# Patient Record
Sex: Male | Born: 1990 | Race: Black or African American | Hispanic: No | Marital: Single | Smoking: Current every day smoker
Health system: Southern US, Community
[De-identification: ages and names within clinical notes are randomized; demographics above are authoritative.]

## PROBLEM LIST (undated history)

## (undated) DIAGNOSIS — J45909 Unspecified asthma, uncomplicated: Secondary | ICD-10-CM

## (undated) HISTORY — PX: FINGER FRACTURE SURGERY: SHX638

## (undated) HISTORY — PX: OTHER SURGICAL HISTORY: SHX169

---
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
## 2009-08-18 ENCOUNTER — Emergency Department (HOSPITAL_COMMUNITY): Admission: EM | Admit: 2009-08-18 | Discharge: 2009-08-18 | Payer: Self-pay | Admitting: Emergency Medicine

## 2012-12-03 ENCOUNTER — Emergency Department (HOSPITAL_COMMUNITY): Payer: BC Managed Care – PPO

## 2012-12-03 ENCOUNTER — Encounter (HOSPITAL_COMMUNITY): Payer: Self-pay

## 2012-12-03 ENCOUNTER — Emergency Department (HOSPITAL_COMMUNITY)
Admission: EM | Admit: 2012-12-03 | Discharge: 2012-12-03 | Disposition: A | Discharge status: Home / Self Care | Payer: BC Managed Care – PPO | Attending: Emergency Medicine | Admitting: Emergency Medicine

## 2012-12-03 DIAGNOSIS — D72829 Elevated white blood cell count, unspecified: Secondary | ICD-10-CM | POA: Insufficient documentation

## 2012-12-03 DIAGNOSIS — J45909 Unspecified asthma, uncomplicated: Secondary | ICD-10-CM | POA: Insufficient documentation

## 2012-12-03 DIAGNOSIS — Z79899 Other long term (current) drug therapy: Secondary | ICD-10-CM | POA: Insufficient documentation

## 2012-12-03 DIAGNOSIS — L0231 Cutaneous abscess of buttock: Secondary | ICD-10-CM | POA: Insufficient documentation

## 2012-12-03 DIAGNOSIS — Z791 Long term (current) use of non-steroidal anti-inflammatories (NSAID): Secondary | ICD-10-CM | POA: Insufficient documentation

## 2012-12-03 DIAGNOSIS — M25559 Pain in unspecified hip: Secondary | ICD-10-CM | POA: Insufficient documentation

## 2012-12-03 DIAGNOSIS — L03317 Cellulitis of buttock: Secondary | ICD-10-CM | POA: Insufficient documentation

## 2012-12-03 DIAGNOSIS — F17200 Nicotine dependence, unspecified, uncomplicated: Secondary | ICD-10-CM | POA: Insufficient documentation

## 2012-12-03 DIAGNOSIS — I889 Nonspecific lymphadenitis, unspecified: Secondary | ICD-10-CM | POA: Insufficient documentation

## 2012-12-03 DIAGNOSIS — L049 Acute lymphadenitis, unspecified: Secondary | ICD-10-CM

## 2012-12-03 DIAGNOSIS — R509 Fever, unspecified: Secondary | ICD-10-CM | POA: Insufficient documentation

## 2012-12-03 HISTORY — DX: Unspecified asthma, uncomplicated: J45.909

## 2012-12-03 LAB — CBC WITH DIFFERENTIAL/PLATELET
Eosinophils Relative: 3 % (ref 0–5)
HCT: 43.6 % (ref 39.0–52.0)
Hemoglobin: 14.2 g/dL (ref 13.0–17.0)
Lymphocytes Relative: 15 % (ref 12–46)
Lymphs Abs: 2.8 10*3/uL (ref 0.7–4.0)
MCV: 82.9 fL (ref 78.0–100.0)
Monocytes Absolute: 1.8 10*3/uL — ABNORMAL HIGH (ref 0.1–1.0)
RBC: 5.26 MIL/uL (ref 4.22–5.81)
WBC: 17.9 10*3/uL — ABNORMAL HIGH (ref 4.0–10.5)

## 2012-12-03 LAB — BASIC METABOLIC PANEL
CO2: 27 mEq/L (ref 19–32)
Calcium: 9.4 mg/dL (ref 8.4–10.5)
Creatinine, Ser: 1.08 mg/dL (ref 0.50–1.35)
Glucose, Bld: 105 mg/dL — ABNORMAL HIGH (ref 70–99)

## 2012-12-03 MED ORDER — DOXYCYCLINE HYCLATE 100 MG IV SOLR
100.0000 mg | Freq: Two times a day (BID) | INTRAVENOUS | Status: DC
  Administered 2012-12-03: 100 mg via INTRAVENOUS
  Filled 2012-12-03 (×3): qty 100

## 2012-12-03 MED ORDER — BUPIVACAINE-EPINEPHRINE PF 0.5-1:200000 % IJ SOLN
20.0000 mL | Freq: Once | INTRAMUSCULAR | Status: AC
  Administered 2012-12-03: 04:00:00
  Filled 2012-12-03: qty 10

## 2012-12-03 MED ORDER — DOXYCYCLINE HYCLATE 100 MG PO CAPS: 100.0000 mg | ORAL_CAPSULE | Freq: Two times a day (BID) | ORAL | Status: DC

## 2012-12-03 MED ORDER — DOXYCYCLINE HYCLATE 100 MG IV SOLR
INTRAVENOUS | Status: AC
  Filled 2012-12-03: qty 100

## 2012-12-03 MED ORDER — LIDOCAINE-EPINEPHRINE 2 %-1:100000 IJ SOLN: 20.0000 mL | Freq: Once | INTRAMUSCULAR | Status: DC

## 2012-12-03 MED ORDER — IBUPROFEN 800 MG PO TABS: 800.0000 mg | ORAL_TABLET | Freq: Three times a day (TID) | ORAL | Status: DC

## 2012-12-03 MED ORDER — LACTATED RINGERS IV BOLUS (SEPSIS)
1000.0000 mL | Freq: Once | INTRAVENOUS | Status: AC
  Administered 2012-12-03: 1000 mL via INTRAVENOUS

## 2012-12-03 MED ORDER — IOHEXOL 300 MG/ML  SOLN
100.0000 mL | Freq: Once | INTRAMUSCULAR | Status: AC | PRN
  Administered 2012-12-03: 100 mL via INTRAVENOUS

## 2012-12-03 MED ORDER — HYDROCODONE-ACETAMINOPHEN 5-325 MG PO TABS: 1.0000 | ORAL_TABLET | Freq: Four times a day (QID) | ORAL | Status: DC | PRN

## 2012-12-03 MED ORDER — LIDOCAINE-EPINEPHRINE (PF) 2 %-1:200000 IJ SOLN
INTRAMUSCULAR | Status: AC
  Administered 2012-12-03: 20 mL
  Filled 2012-12-03: qty 20

## 2012-12-03 NOTE — ED Notes (Signed)
Patient with no complaints at this time. Respirations even and unlabored. Skin warm/dry. Discharge instructions reviewed with patient at this time. Patient given opportunity to voice concerns/ask questions. IV removed per policy and band-aid applied to site. Patient discharged at this time and left Emergency Department with steady gait.  

## 2012-12-03 NOTE — ED Notes (Signed)
Pt reports he has a large swelling to inside of left upper thigh for several days.  Pt denies drainage from site, states he has had smaller abcesses similar but this is much bigger.

## 2012-12-03 NOTE — ED Notes (Signed)
Patient meds infusing well. He will be discharged after they are completed. He verbalized unerstanding

## 2012-12-03 NOTE — ED Provider Notes (Signed)
CSN: [id number]     Arrival date & time 12/03/12  0243 History     First MD Initiated Contact with Patient 12/03/12 0330     No chief complaint on file.   HPI Frank Diaz is a 22 y.o. male with history of asthma and prior abscesses to the buttocks and face presents with a left inguinal swelling for the last week. It's been growing in size steadily but has not been that painful over the last 7 days, and over the last 2 days, the swelling increased greatly in size and in pain, is now severe, it hurts to walk, it hurts on palpation, denies any fevers, chills, chest pain, shortness of breath, nausea vomiting, diarrhea, dysuria, penile discharge.   Past Medical History  Diagnosis Date  . Asthma    History reviewed. No pertinent past surgical history. No family history on file. History  Substance Use Topics  . Smoking status: Current Every Day Smoker  . Smokeless tobacco: Not on file  . Alcohol Use: No    Review of Systems At least 10pt or greater review of systems completed and are negative except where specified in the HPI.  Allergies  Review of patient's allergies indicates no known allergies.  Home Medications   Current Outpatient Rx  Name  Route  Sig  Dispense  Refill  . doxycycline (VIBRAMYCIN) 100 MG capsule   Oral   Take 1 capsule (100 mg total) by mouth 2 (two) times daily.   20 capsule   0   . HYDROcodone-acetaminophen (NORCO/VICODIN) 5-325 MG per tablet   Oral   Take 1-2 tablets by mouth every 6 (six) hours as needed for pain.   17 tablet   0   . ibuprofen (ADVIL,MOTRIN) 800 MG tablet   Oral   Take 1 tablet (800 mg total) by mouth 3 (three) times daily.   21 tablet   0    BP 161/96  Pulse 111  Temp(Src) 100.9 F (38.3 C) (Oral)  Resp 20  Ht 6' (1.829 m)  Wt 305 lb (138.347 kg)  BMI 41.36 kg/m2  SpO2 98% Physical Exam  Musculoskeletal:       Legs:   Nursing notes reviewed.  Electronic medical record reviewed. VITAL SIGNS:   Filed Vitals:   12/03/12 0302  BP: 161/96  Pulse: 111  Temp: 100.9 F (38.3 C)  TempSrc: Oral  Resp: 20  Height: 6' (1.829 m)  Weight: 305 lb (138.347 kg)  SpO2: 98%   CONSTITUTIONAL: Awake, oriented, appears non-toxic HENT: Atraumatic, normocephalic, oral mucosa pink and moist, airway patent. Nares patent without drainage. External ears normal. EYES: Conjunctiva clear, EOMI, PERRLA NECK: Trachea midline, non-tender, supple CARDIOVASCULAR: Normal heart rate, Normal rhythm, No murmurs, rubs, gallops PULMONARY/CHEST: Clear to auscultation, no rhonchi, wheezes, or rales. Symmetrical breath sounds. Non-tender. ABDOMINAL: Non-distended, soft, non-tender - no rebound or guarding.  BS normal. GU: Normal circumcised male, no discharge, no rash or sores, no tenderness in the testicles or epididymis to palpation. No hernias appreciated.  Large spongy mass very tender to palpation in the left inguinal region mid thigh - see picture. NEUROLOGIC: Non-focal, moving all four extremities, no gross sensory or motor deficits. EXTREMITIES: No clubbing, cyanosis, or edema SKIN: Warm, Dry, No erythema, No rash  ED Course   INCISION AND DRAINAGE Date/Time: 12/03/2012 7:20 AM Performed by: Jones Skene Authorized by: Jones Skene Consent: Verbal consent obtained. Consent given by: patient Patient identity confirmed: verbally with patient Type: abscess Body area: lower extremity  Location details: left hip Anesthesia: local infiltration Local anesthetic: lidocaine 2% with epinephrine and bupivacaine 0.5% with epinephrine Anesthetic total: 12 ml Patient sedated: no Scalpel size: 11 Incision type: single straight Complexity: simple Drainage characteristics: None. Drainage amount: scant Wound treatment: wound left open (Half centimeter of wound was left open, the rest was closed using a running 3-0 Prolene.) Packing material: none Patient tolerance: Patient tolerated the procedure well with no immediate  complications.   (including critical care time)  Labs Reviewed  CBC WITH DIFFERENTIAL - Abnormal; Notable for the following:    WBC 17.9 (*)    Neutro Abs 12.7 (*)    Monocytes Absolute 1.8 (*)    All other components within normal limits  BASIC METABOLIC PANEL - Abnormal; Notable for the following:    Glucose, Bld 105 (*)    All other components within normal limits   Ct Pelvis W Contrast  12/03/2012   *RADIOLOGY REPORT*  Clinical Data:  Painful lesion proximal left thigh.  CT PELVIS WITH CONTRAST  Technique:  Multidetector CT imaging of the pelvis was performed using the standard protocol following the bolus administration of intravenous contrast.  Contrast: OMNIPAQUE IOHEXOL 300 MG/ML  SOLN  Comparison:   None.  Findings:  There is infiltration of subcutaneous fat in the medial aspect of the left thigh.  A large lymph node is identified measuring 4.0 x 2.0 cm.  There is some fluid within the medial aspect of this lymph node compatible with suppurative adenitis. Additional smaller bilateral groin lymph nodes are identified. Visualized musculature appears normal.  Imaged intrapelvic contents are unremarkable.  No focal bony abnormality is identified.  IMPRESSION: Cellulitis medial left side with associated suppurative adenitis.   Original Report Authenticated By: Holley Dexter, M.D.   1. Suppurative lymphadenitis   2. Thigh pain, left   3. Fever   4. Leukocytosis     MDM  Patient presents with large swelling to the left inguinal region. Site ultrasound shows cobblestoning consistent with cellulitis, there did appear to be a pocket of pus, verbally consented patient to perform incision and drainage, incised 3 cm incision with minimal return. Sutured incision leaving half a centimeter open to drain in case it abscesses. Palpated into the wound, there appears to be a deeper pocket of infection.  Obtain CT the patient's pelvis and thighs, showing likely super lymphadenitis of the left  groin. No abscess present at this time. Patient does have a increased H[place] count at 17.9 he is febrile. Discussed this patient's case with Dr. Lovell Sheehan surgeon on call, patient will followup next Tuesday, we'll put him on doxycycline nto cover for MRSA and strep.  Pt stands to return to the emergency department for any draining pus, worsening fevers, chills, pain in the leg, or any other concerning symptoms.     I explained the diagnosis and have given explicit precautions to return to the ER including any other new or worsening symptoms. The patient understands and accepts the medical plan as it's been dictated and I have answered his questions. Discharge instructions concerning home care and prescriptions have been given.  The patient is STABLE and is discharged to home in good condition.   Jones Skene, MD 12/03/12 [phone]

## 2012-12-05 ENCOUNTER — Encounter (HOSPITAL_COMMUNITY): Payer: Self-pay | Admitting: *Deleted

## 2012-12-05 ENCOUNTER — Emergency Department (HOSPITAL_COMMUNITY)
Admission: EM | Admit: 2012-12-05 | Discharge: 2012-12-05 | Disposition: A | Discharge status: Home / Self Care | Payer: BC Managed Care – PPO | Attending: Emergency Medicine | Admitting: Emergency Medicine

## 2012-12-05 ENCOUNTER — Emergency Department (HOSPITAL_COMMUNITY): Payer: BC Managed Care – PPO

## 2012-12-05 DIAGNOSIS — Z791 Long term (current) use of non-steroidal anti-inflammatories (NSAID): Secondary | ICD-10-CM | POA: Insufficient documentation

## 2012-12-05 DIAGNOSIS — R609 Edema, unspecified: Secondary | ICD-10-CM | POA: Insufficient documentation

## 2012-12-05 DIAGNOSIS — Z79899 Other long term (current) drug therapy: Secondary | ICD-10-CM | POA: Insufficient documentation

## 2012-12-05 DIAGNOSIS — L0390 Cellulitis, unspecified: Secondary | ICD-10-CM

## 2012-12-05 DIAGNOSIS — J45909 Unspecified asthma, uncomplicated: Secondary | ICD-10-CM | POA: Insufficient documentation

## 2012-12-05 DIAGNOSIS — M25559 Pain in unspecified hip: Secondary | ICD-10-CM | POA: Insufficient documentation

## 2012-12-05 DIAGNOSIS — Z87891 Personal history of nicotine dependence: Secondary | ICD-10-CM | POA: Insufficient documentation

## 2012-12-05 MED ORDER — HYDROCODONE-ACETAMINOPHEN 5-325 MG PO TABS
2.0000 | ORAL_TABLET | ORAL | Status: DC | PRN
Start: 2012-12-05 — End: 2013-12-07

## 2012-12-05 MED ORDER — LIDOCAINE HCL (PF) 1 % IJ SOLN
INTRAMUSCULAR | Status: AC
  Filled 2012-12-05: qty 5

## 2012-12-05 NOTE — ED Notes (Signed)
Pt had I and D done of lt thigh on 8/6 , sutures in place.  Here for recheck.

## 2012-12-05 NOTE — ED Provider Notes (Signed)
CSN: [id number]     Arrival date & time 12/05/12  1407 History     First MD Initiated Contact with Patient 12/05/12 1441     Chief Complaint  Patient presents with  . Wound Check   (Consider location/radiation/quality/duration/timing/severity/associated sxs/prior Treatment) HPI Comments: Patient presents for recheck of an I&D that was done on August 6. He had swelling of his left proximal thigh at that time. A CT scan was done which showed some lymphadenopathy but no discrete abscess. Denying he was done and there is small amount of per discharge. This was a 3 cm I&D and the proximal portion was closed with sutures. He states that the swelling has gotten more pronounced. He is on doxycycline and he is no longer having any fevers. He does feel like the pain and swelling has gotten more pronounced in the area. He denies any drainage from the wound. He does have a history of skin abscesses in the past. He has an appointment to followup with surgery on August 18.  Patient is a 22 y.o. male presenting with wound check.  Wound Check Pertinent negatives include no chest pain, no abdominal pain, no headaches and no shortness of breath.    Past Medical History  Diagnosis Date  . Asthma    Past Surgical History  Procedure Laterality Date  . Tubes in ears     History reviewed. No pertinent family history. History  Substance Use Topics  . Smoking status: Former Games developer  . Smokeless tobacco: Not on file  . Alcohol Use: No    Review of Systems  Constitutional: Negative for fever, chills, diaphoresis and fatigue.  HENT: Negative for congestion, rhinorrhea and sneezing.   Eyes: Negative.   Respiratory: Negative for cough, chest tightness and shortness of breath.   Cardiovascular: Negative for chest pain and leg swelling.  Gastrointestinal: Negative for nausea, vomiting, abdominal pain, diarrhea and blood in stool.  Genitourinary: Negative for frequency, hematuria, flank pain and difficulty  urinating.  Musculoskeletal: Negative for back pain and arthralgias.  Skin: Positive for wound. Negative for rash.  Neurological: Negative for dizziness, speech difficulty, weakness, numbness and headaches.    Allergies  Review of patient's allergies indicates no known allergies.  Home Medications   Current Outpatient Rx  Name  Route  Sig  Dispense  Refill  . doxycycline (VIBRAMYCIN) 100 MG capsule   Oral   Take 1 capsule (100 mg total) by mouth 2 (two) times daily.   20 capsule   0   . HYDROcodone-acetaminophen (NORCO/VICODIN) 5-325 MG per tablet   Oral   Take 1-2 tablets by mouth every 6 (six) hours as needed for pain.   17 tablet   0   . ibuprofen (ADVIL,MOTRIN) 800 MG tablet   Oral   Take 1 tablet (800 mg total) by mouth 3 (three) times daily.   21 tablet   0   . HYDROcodone-acetaminophen (NORCO/VICODIN) 5-325 MG per tablet   Oral   Take 2 tablets by mouth every 4 (four) hours as needed for pain.   15 tablet   0    BP 143/85  Pulse 92  Temp(Src) 98.8 F (37.1 C) (Oral)  Resp 18  Ht 6' (1.829 m)  Wt 305 lb (138.347 kg)  BMI 41.36 kg/m2  SpO2 99% Physical Exam  Constitutional: He is oriented to person, place, and time. He appears well-developed and well-nourished.  HENT:  Head: Normocephalic and atraumatic.  Eyes: Pupils are equal, round, and reactive to light.  Neck: Normal range of motion. Neck supple.  Cardiovascular: Normal rate, regular rhythm and normal heart sounds.   Pulmonary/Chest: Effort normal and breath sounds normal. No respiratory distress. He has no wheezes. He has no rales. He exhibits no tenderness.  Abdominal: Soft. Bowel sounds are normal. There is no tenderness. There is no rebound and no guarding.  Musculoskeletal: Normal range of motion. He exhibits no edema.  Lymphadenopathy:    He has no cervical adenopathy.  Neurological: He is alert and oriented to person, place, and time.  Skin: Skin is warm and dry. No rash noted.  Patient  has a 3 cm area on his left proximal thigh that status post an I&D. There are running sutures in place. The area is pretty well closed with no drainage. There is a 4-5 cm area of swelling. There is edema to the area. No discrete fluctuance is noted. There is mild surrounding erythema.  Psychiatric: He has a normal mood and affect.    ED Course   Procedures (including critical care time)  Labs Reviewed - No data to display [place] Extrem Low Left Ltd  12/05/2012   *RADIOLOGY REPORT*  Clinical Data: 22 year old male with left proximal thigh and lower extremity palpable abnormality.  Possible abscess.  ULTRASOUND LEFT LOWER EXTREMITY LIMITED  Technique:  Ultrasound examination of the region of interest in the left lower extremity was performed.  Comparison:  Pelvis CT including the proximal left lower extremity 12/03/2012.  Findings: Scanning in the area of clinical concern redemonstrates oval hypoechoic but solid and vascular left inguinal lymph nodes, up to 11 mm short axis.  Subcutaneous edema is demonstrated, with a small volume of fluid tracking in the deep soft tissue spaces, but no organized or drainable fluid collection.  IMPRESSION: Stable appearance of the proximal left lower extremity with lymphadenopathy and subcutaneous edema, but no abscess/organized or drainable fluid collection.   Original Report Authenticated By: Erskine Speed, M.D.   1. Cellulitis     MDM  I did removed the Prolene sutures and attempted to express pus however this was unsuccessful. There was only bloody discharge. I did a bedside ultrasound by do not see any discrete abscess. I will go ahead and get a ultrasound done by radiology to assess for deeper abscess.    No drainable abscess is noted on ultrasound. Patient's cellulitis appears to have improved but there is slightly larger amount swelling in the area of the I&D. On exam it seems to be mostly edema. Advised him to continue antibiotics. The won't be left open. He has a  followup appointment with Dr. Lovell Sheehan on August 18 but I advised them if he hasn't noted improvement over the weekend and to attempt to follow Dr. Lovell Sheehan on Monday. Advised to return here if his symptoms worsen or is unable to get in to see Dr. Lovell Sheehan.  Rolan Bucco, MD 12/05/12 [phone]

## 2013-12-07 ENCOUNTER — Other Ambulatory Visit: Payer: Self-pay

## 2013-12-07 ENCOUNTER — Emergency Department (HOSPITAL_COMMUNITY): Payer: BC Managed Care – PPO

## 2013-12-07 ENCOUNTER — Emergency Department (HOSPITAL_COMMUNITY)
Admission: EM | Admit: 2013-12-07 | Discharge: 2013-12-08 | Disposition: A | Discharge status: Home / Self Care | Payer: BC Managed Care – PPO | Attending: Emergency Medicine | Admitting: Emergency Medicine

## 2013-12-07 DIAGNOSIS — R079 Chest pain, unspecified: Secondary | ICD-10-CM | POA: Insufficient documentation

## 2013-12-07 DIAGNOSIS — Z87891 Personal history of nicotine dependence: Secondary | ICD-10-CM | POA: Insufficient documentation

## 2013-12-07 DIAGNOSIS — M79609 Pain in unspecified limb: Secondary | ICD-10-CM | POA: Insufficient documentation

## 2013-12-07 DIAGNOSIS — M546 Pain in thoracic spine: Secondary | ICD-10-CM | POA: Insufficient documentation

## 2013-12-07 DIAGNOSIS — J45901 Unspecified asthma with (acute) exacerbation: Secondary | ICD-10-CM | POA: Insufficient documentation

## 2013-12-07 DIAGNOSIS — R0789 Other chest pain: Secondary | ICD-10-CM

## 2013-12-07 NOTE — ED Notes (Signed)
Pain rt postero-lateral chest.  2 weeks ago had episode of numbness of lt side of body , and since then has had pain in rt postero- lateral chest.  Alert , NAD

## 2013-12-07 NOTE — ED Notes (Signed)
Patient reports mid back for approximately a week. Also reports mid chest pain for two days that is worse with deep inspiration. Reports shocking pains to right leg that has bothered him for two months.

## 2013-12-07 NOTE — ED Provider Notes (Signed)
CSN: [id number]     Arrival date & time 12/07/13  2026 History   First MD Initiated Contact with Patient 12/07/13 2220     Chief Complaint  Patient presents with  . Back Pain     (Consider location/radiation/quality/duration/timing/severity/associated sxs/prior Treatment) Patient is a 23 y.o. male presenting with back pain. The history is provided by the patient.  Back Pain Location:  Thoracic spine Quality:  Stabbing Radiates to:  R posterior upper leg Pain severity:  Moderate Pain is:  Same all the time Onset quality:  Gradual Duration:  1 week Timing:  Constant Progression:  Worsening Chronicity:  New Context: physical stress   Relieved by:  Nothing Worsened by:  Movement, bending and ambulation Ineffective treatments:  OTC medications Associated symptoms: chest pain   Associated symptoms: no bladder incontinence, no bowel incontinence, no dysuria, no fever and no headaches    Frank Diaz is a 23 y.o. obese male who presents to the ED with pain to the right thoracic area that started about a week ago. The pain radiates to the right anterior rib area. He states the pain is so bad sometimes he feels short of breath. For the past 2 months he has had occasional right leg pain that comes and goes. When it comes it feels sharp. He sometimes has chest pain that comes when he takes a deep breath. 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   Past Medical History  Diagnosis Date  . Asthma    Past Surgical History  Procedure Laterality Date  . Tubes in ears     No family history on file. History  Substance Use Topics  . Smoking status: Former Games developermoker  . Smokeless tobacco: Not on file  . Alcohol Use: No    Review of Systems  Constitutional: Negative for fever and chills.  HENT: Negative.   Eyes: Negative for visual disturbance.  Respiratory: Positive for shortness of breath. Negative for cough.   Cardiovascular: Positive for chest pain.  Gastrointestinal: Negative for  nausea, vomiting, diarrhea and bowel incontinence.  Genitourinary: Negative for bladder incontinence and dysuria.  Musculoskeletal: Positive for back pain.  Skin: Negative for rash.  Neurological: Negative for syncope and headaches.  Psychiatric/Behavioral: Negative for confusion. The patient is not nervous/anxious.     Allergies  Review of patient's allergies indicates no known allergies.  Home Medications   Prior to Admission medications   Medication Sig Start Date End Date Taking? Authorizing Provider  aspirin-acetaminophen-caffeine (EXCEDRIN MIGRAINE) [id number] MG per tablet Take 1 tablet by mouth every 6 (six) hours as needed for headache.   Yes Historical Provider, MD   BP 161/99  Pulse 92  Temp(Src) 99.1 F (37.3 C) (Oral)  Resp 20  Ht 6' (1.829 m)  Wt 310 lb (140.615 kg)  BMI 42.03 kg/m2  SpO2 98% Physical Exam  Nursing note and vitals reviewed. Constitutional: He is oriented to person, place, and time. He appears well-developed and well-nourished. No distress.  HENT:  Head: Normocephalic and atraumatic.  Eyes: EOM are normal. Pupils are equal, round, and reactive to light.  Neck: Normal range of motion. Neck supple.  Cardiovascular: Normal rate and regular rhythm.   Pulmonary/Chest: Effort normal. No respiratory distress. He has no wheezes. He has no rales.  Abdominal: Soft. Bowel sounds are normal. There is no tenderness.  Musculoskeletal: Normal range of motion. He exhibits no edema.       Thoracic back: He exhibits tenderness and spasm. He exhibits normal range of motion and  normal pulse.       Back:  Pain radiates to the anterior rib area.  Neurological: He is alert and oriented to person, place, and time. He has normal strength. No cranial nerve deficit or sensory deficit. Coordination and gait normal.  Reflex Scores:      Bicep reflexes are 2+ on the right side and 2+ on the left side.      Brachioradialis reflexes are 2+ on the right side and 2+ on the  left side.      Patellar reflexes are 2+ on the right side and 2+ on the left side.      Achilles reflexes are 2+ on the right side and 2+ on the left side. Skin: Skin is warm and dry.  Psychiatric: He has a normal mood and affect. His behavior is normal.    ED Course  Procedures (including critical care time) Labs Review Labs Reviewed - No data to display EKG Interpretation Dg Chest 2 View  12/07/2013   CLINICAL DATA:  Mid back pain for 1 week. Chest pain, worse with deep inspiration.  EXAM: CHEST  2 VIEW  COMPARISON:  None.  FINDINGS: The lungs are well-aerated and clear. There is no evidence of focal opacification, pleural effusion or pneumothorax.  The heart is normal in size; the mediastinal contour is within normal limits. No acute osseous abnormalities are seen.  IMPRESSION: No acute cardiopulmonary process seen.   Electronically Signed   By: Roanna Raider M.D.   On: 12/07/2013 21:13    MDM  23 y.o. morbidly obese male with right thoracic pain x 1 week and having mid chest pain with deep breath. Also complains of off and on right leg pain that has been there for over two months. Stable for discharge with normal chest x-ray and EKG. Discussed with the patient in detail need for follow up with a primary care doctor. He voices understanding and agrees with plan.    Medication List    TAKE these medications       cyclobenzaprine 10 MG tablet  Commonly known as:  FLEXERIL  Take 1 tablet (10 mg total) by mouth 2 (two) times daily as needed for muscle spasms.     HYDROcodone-acetaminophen 5-325 MG per tablet  Commonly known as:  NORCO/VICODIN  Take 1 tablet by mouth every 4 (four) hours as needed.     naproxen 500 MG tablet  Commonly known as:  NAPROSYN  Take 1 tablet (500 mg total) by mouth 2 (two) times daily.      ASK your doctor about these medications       aspirin-acetaminophen-caffeine 250-250-65 MG per tablet  Commonly known as:  EXCEDRIN MIGRAINE  Take 1 tablet by  mouth every 6 (six) hours as needed for headache.          [facility] Orlene Och, [place] 12/08/13 [phone]

## 2013-12-08 MED ORDER — CYCLOBENZAPRINE HCL 10 MG PO TABS: 10.0000 mg | ORAL_TABLET | Freq: Two times a day (BID) | ORAL | Status: DC | PRN

## 2013-12-08 MED ORDER — NAPROXEN 500 MG PO TABS: 500.0000 mg | ORAL_TABLET | Freq: Two times a day (BID) | ORAL | Status: DC

## 2013-12-08 MED ORDER — HYDROCODONE-ACETAMINOPHEN 5-325 MG PO TABS: 1.0000 | ORAL_TABLET | ORAL | Status: DC | PRN

## 2013-12-08 NOTE — Discharge Instructions (Signed)
Your EKG and Chest x-ray tonight are normal. You need to have a primary care doctor to see for a complete physical exam and blood work. We are treating you tonight for muscle strain of your back. Do not drive while taking the narcotic or muscle relaxant because they will make you sleepy. Rest for the next couple days before going back to work.

## 2013-12-08 NOTE — ED Provider Notes (Signed)
Medical screening examination/treatment/procedure(s) were performed by non-physician practitioner and as supervising physician I was immediately available for consultation/collaboration.   EKG Interpretation None        Hanley SeamenJohn L Wilfred Siverson, MD 12/08/13 [phone]

## 2014-06-15 IMAGING — US US EXTREM LOW*L* LIMITED
1 series · 14 of 25 positions shown · non-contrast
Comparison: Pelvis CT including the proximal left lower extremity
12/03/2012.

CLINICAL DATA: 22-year-old male with left proximal thigh and lower
extremity palpable abnormality.  Possible abscess.

ULTRASOUND LEFT LOWER EXTREMITY LIMITED
TECHNIQUE: Ultrasound examination of the region of interest in the
left lower extremity was performed.

[Series 1: us extrem low*left* limited · 0.06mm/px · 14 of 32 slices shown]
[im 1/32]
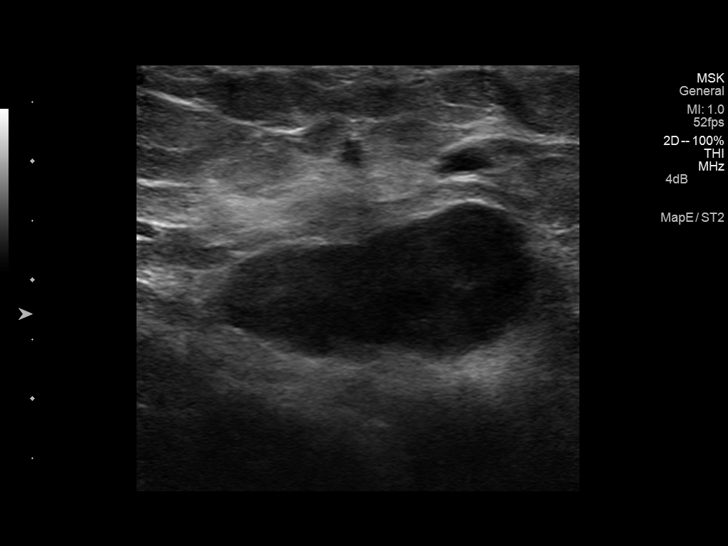
[im 3/32]
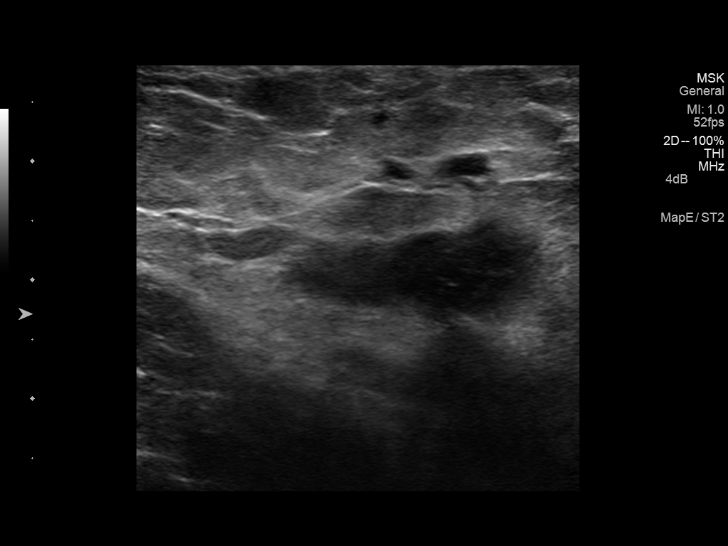
[im 6/32]
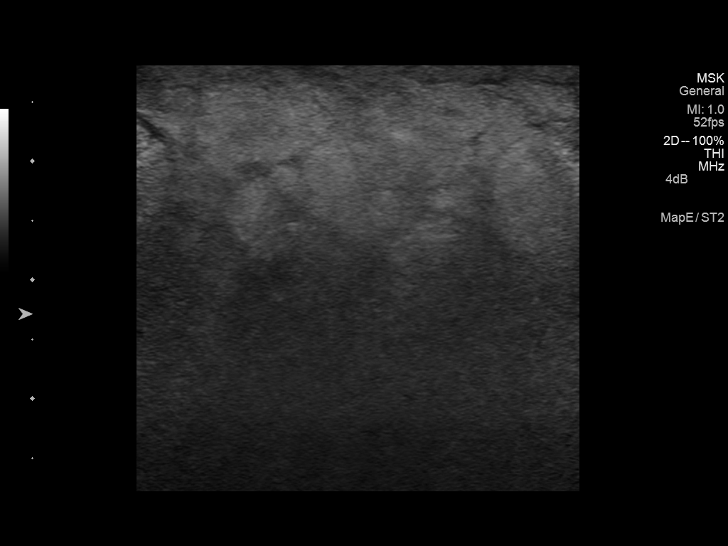
[im 8/32]
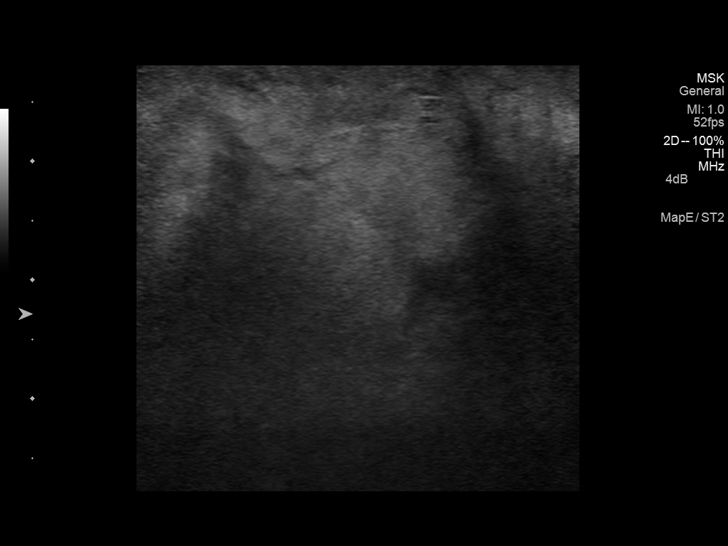
[im 11/32]
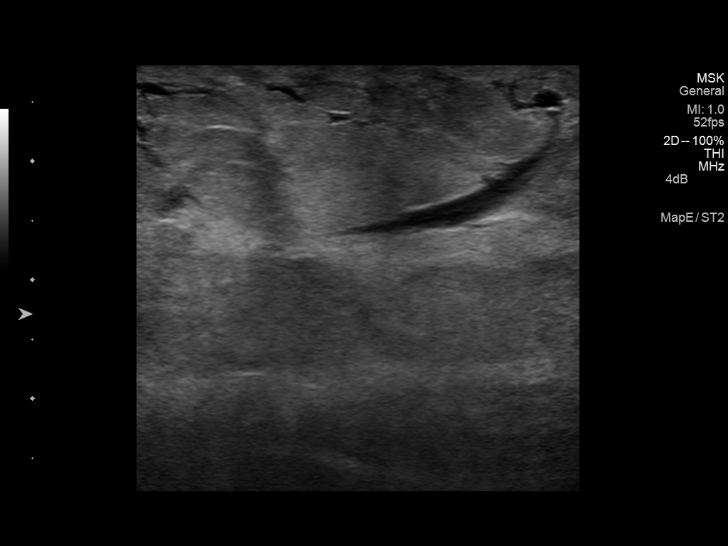
[im 12/32]
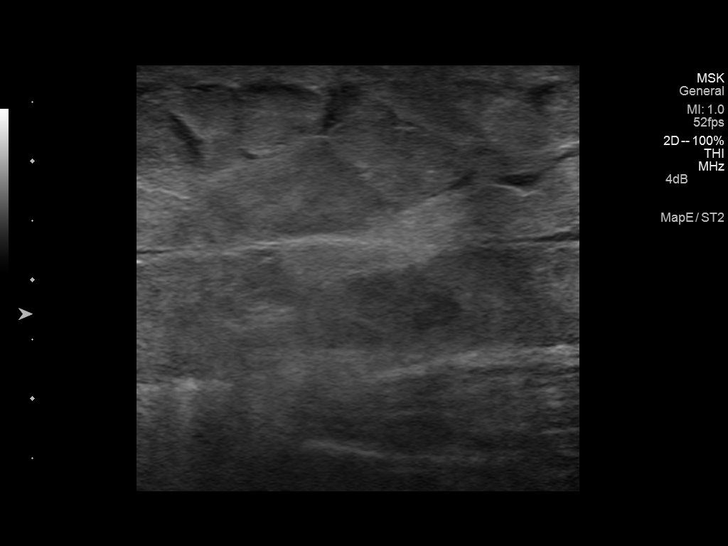
[im 15/32]
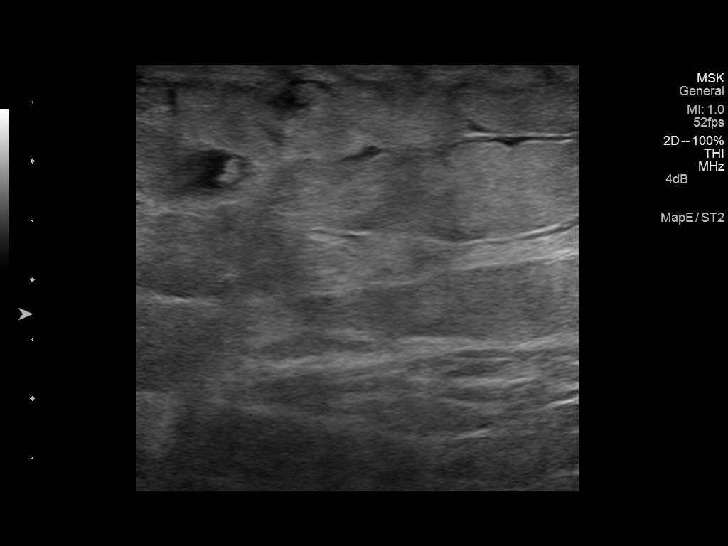
[im 17/32]
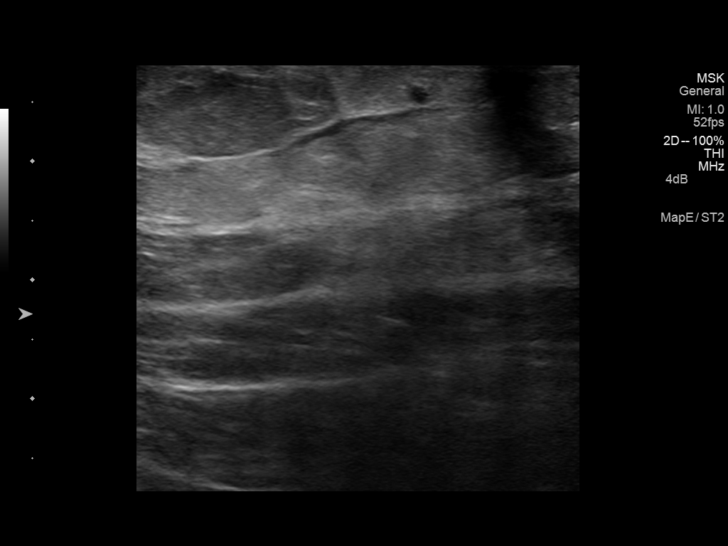
[im 20/32]
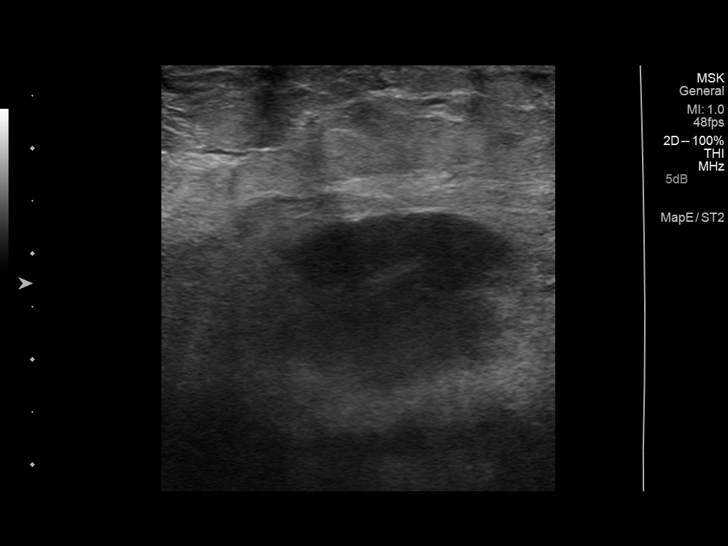
[im 21/32]
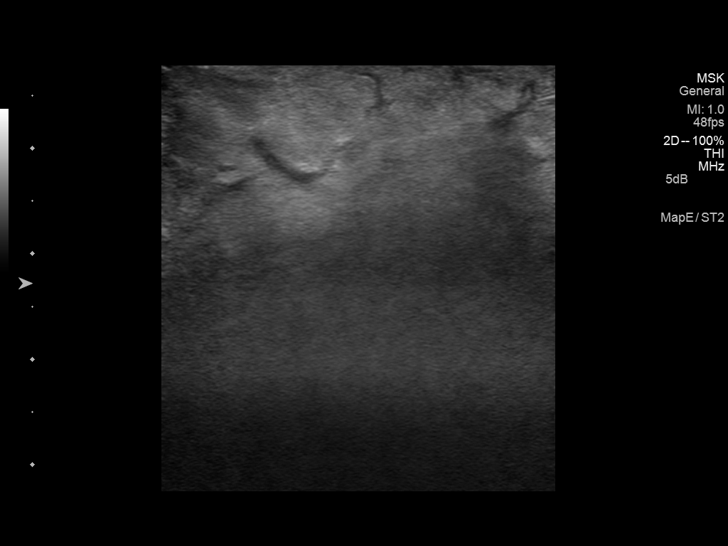
[im 24/32]
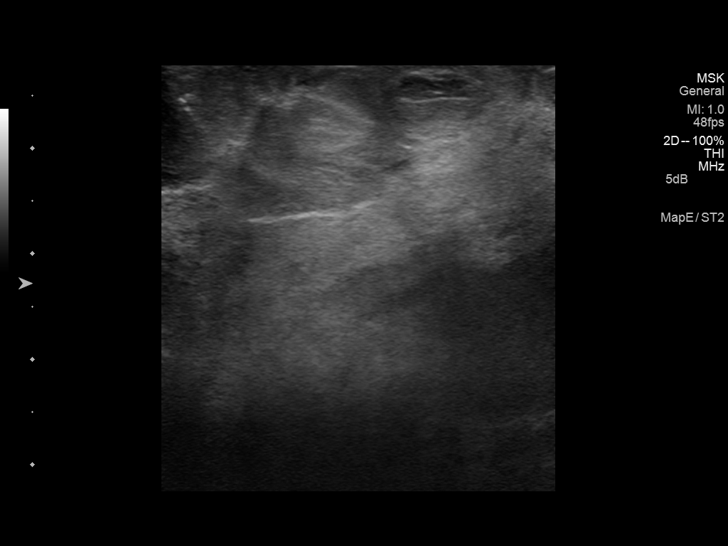
[im 26/32]
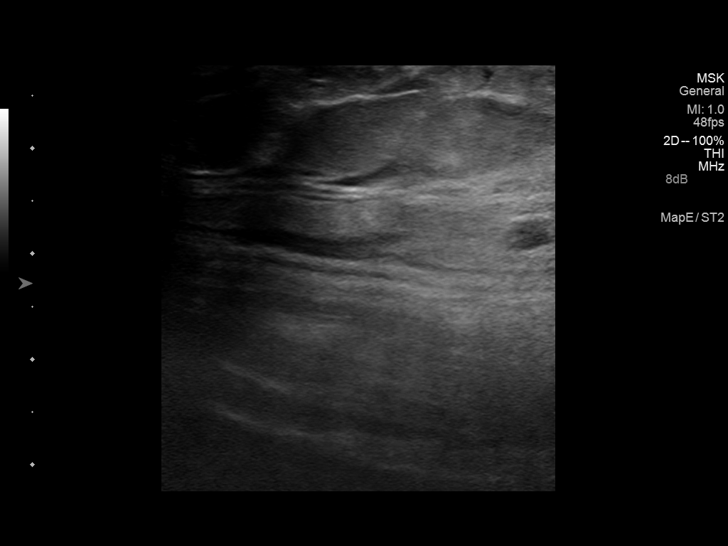
[im 29/32]
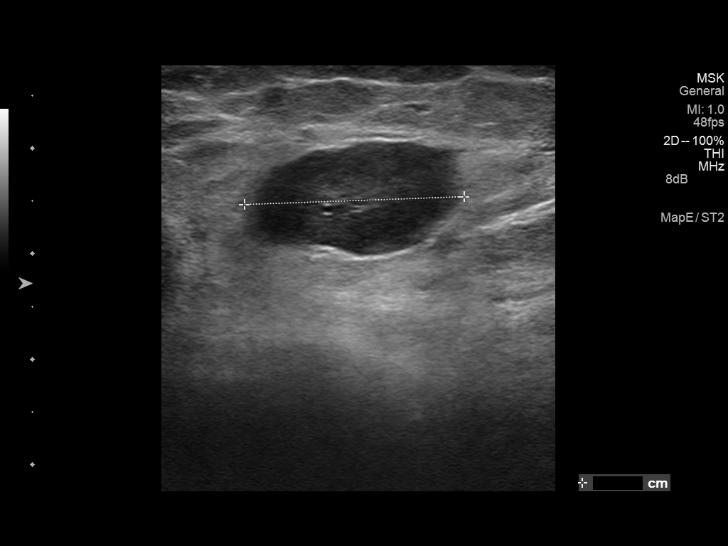
[im 32/32]
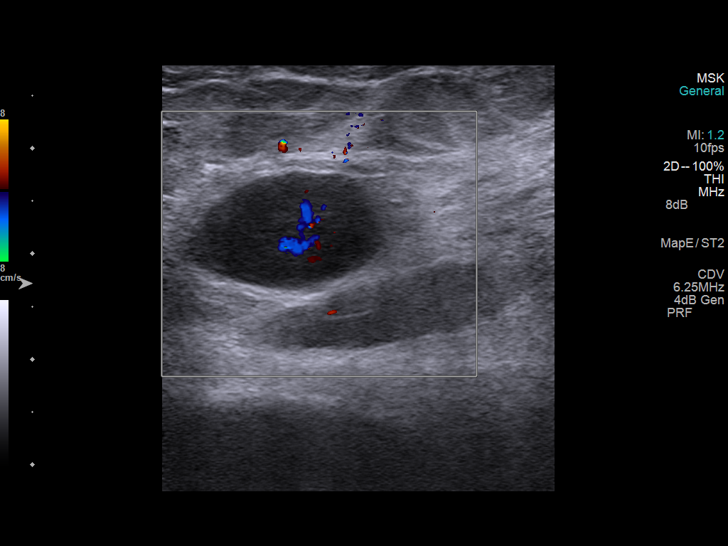

[14 of 25 positions shown; findings below may reference images not displayed]

FINDINGS: Scanning in the area of clinical concern redemonstrates
oval hypoechoic but solid and vascular left inguinal lymph nodes,
up to 11 mm short axis.  Subcutaneous edema is demonstrated, with a
small volume of fluid tracking in the deep soft tissue spaces, but
no organized or drainable fluid collection.
IMPRESSION: Stable appearance of the proximal left lower extremity with
lymphadenopathy and subcutaneous edema, but no abscess/organized or
drainable fluid collection.

## 2014-09-25 ENCOUNTER — Encounter (HOSPITAL_COMMUNITY): Payer: Self-pay | Admitting: Cardiology

## 2014-09-25 ENCOUNTER — Emergency Department (HOSPITAL_COMMUNITY): Payer: BLUE CROSS/BLUE SHIELD

## 2014-09-25 ENCOUNTER — Emergency Department (HOSPITAL_COMMUNITY)
Admission: EM | Admit: 2014-09-25 | Discharge: 2014-09-25 | Disposition: A | Discharge status: Home / Self Care | Payer: BLUE CROSS/BLUE SHIELD | Attending: Emergency Medicine | Admitting: Emergency Medicine

## 2014-09-25 DIAGNOSIS — J069 Acute upper respiratory infection, unspecified: Secondary | ICD-10-CM | POA: Insufficient documentation

## 2014-09-25 DIAGNOSIS — K122 Cellulitis and abscess of mouth: Secondary | ICD-10-CM | POA: Insufficient documentation

## 2014-09-25 DIAGNOSIS — Z720 Tobacco use: Secondary | ICD-10-CM | POA: Diagnosis not present

## 2014-09-25 DIAGNOSIS — J45901 Unspecified asthma with (acute) exacerbation: Secondary | ICD-10-CM | POA: Insufficient documentation

## 2014-09-25 DIAGNOSIS — Z791 Long term (current) use of non-steroidal anti-inflammatories (NSAID): Secondary | ICD-10-CM | POA: Diagnosis not present

## 2014-09-25 DIAGNOSIS — R05 Cough: Secondary | ICD-10-CM | POA: Diagnosis present

## 2014-09-25 DIAGNOSIS — K1379 Other lesions of oral mucosa: Secondary | ICD-10-CM

## 2014-09-25 MED ORDER — OXYMETAZOLINE HCL 0.05 % NA SOLN
1.0000 | Freq: Once | NASAL | Status: AC
  Administered 2014-09-25: 1 via NASAL
  Filled 2014-09-25: qty 15

## 2014-09-25 MED ORDER — AMOXICILLIN 500 MG PO CAPS: 500.0000 mg | ORAL_CAPSULE | Freq: Three times a day (TID) | ORAL | Status: DC

## 2014-09-25 MED ORDER — AMOXICILLIN 250 MG PO CAPS
500.0000 mg | ORAL_CAPSULE | Freq: Once | ORAL | Status: AC
  Administered 2014-09-25: 500 mg via ORAL
  Filled 2014-09-25: qty 2

## 2014-09-25 NOTE — ED Provider Notes (Signed)
CSN: [id number]     Arrival date & time 09/25/14  [id number] History   First MD Initiated Contact with Patient 09/25/14 [phone]     Chief Complaint  Patient presents with  . Emesis  . Foreign Body     (Consider location/radiation/quality/duration/timing/severity/associated sxs/prior Treatment) HPI Comments: ` Patient is a 24 year old male who presents to the emergency department with complaint of "feels like something is stuck in my throat", an vomiting. Patient states he's been having some problems with congestion and generally not feeling well for about 2 or 3 days, but on last evening he began to have the sensation that something was stuck in his throat and frequent vomiting. He states it is worse when he is lying down. It's bothersome when he attempts to eat or drink. He does not recall swallowing anything that should cause of obstruction. He's not had any injury or trauma to the neck or throat. He's not had any recent operations or procedures. The patient states that when he does vomit usually vomits liquids. He has not had fever that he is measured. He presents now for evaluation of these problems.     The history is provided by the patient.    Past Medical History  Diagnosis Date  . Asthma    Past Surgical History  Procedure Laterality Date  . Tubes in ears     History reviewed. No pertinent family history. History  Substance Use Topics  . Smoking status: Current Every Day Smoker  . Smokeless tobacco: Not on file  . Alcohol Use: Yes     Comment: occasional     Review of Systems  HENT: Positive for congestion.   Respiratory: Positive for cough and wheezing.   Gastrointestinal: Positive for vomiting.       Difficulty swallowing  All other systems reviewed and are negative.     Allergies  Review of patient's allergies indicates no known allergies.  Home Medications   Prior to Admission medications   Medication Sig Start Date End Date Taking? Authorizing Provider   aspirin-acetaminophen-caffeine (EXCEDRIN MIGRAINE) [phone] MG per tablet Take 1 tablet by mouth every 6 (six) hours as needed for headache.    Historical Provider, MD  cyclobenzaprine (FLEXERIL) 10 MG tablet Take 1 tablet (10 mg total) by mouth 2 (two) times daily as needed for muscle spasms. Patient not taking: Reported on 09/25/2014 12/08/13   Janne NapoleonHope M Neese, NP  HYDROcodone-acetaminophen (NORCO/VICODIN) 5-325 MG per tablet Take 1 tablet by mouth every 4 (four) hours as needed. Patient not taking: Reported on 09/25/2014 12/08/13   Janne NapoleonHope M Neese, NP  naproxen (NAPROSYN) 500 MG tablet Take 1 tablet (500 mg total) by mouth 2 (two) times daily. Patient not taking: Reported on 09/25/2014 12/08/13   Janne NapoleonHope M Neese, NP   BP 128/93 mmHg  Pulse 80  Temp(Src) 97.8 F (36.6 C) (Oral)  Resp 18  Ht 6' (1.829 m)  Wt 310 lb (140.615 kg)  BMI 42.03 kg/m2  SpO2 98% Physical Exam  Constitutional: He is oriented to person, place, and time. He appears well-developed and well-nourished.  Non-toxic appearance.  HENT:  Head: Normocephalic.  Right Ear: Tympanic membrane and external ear normal.  Left Ear: Tympanic membrane and external ear normal.  Nasal congestion present.  Uvula is enlarged and extends down the back of the throat. The airway is patent. Speech is clear. There is no trismus.  Eyes: EOM and lids are normal. Pupils are equal, round, and reactive to light.  Neck: Normal range  of motion. Neck supple. Carotid bruit is not present.  Cardiovascular: Normal rate, regular rhythm, normal heart sounds, intact distal pulses and normal pulses.   Pulmonary/Chest: Breath sounds normal. No respiratory distress.  Abdominal: Soft. Bowel sounds are normal. There is no tenderness. There is no guarding.  Musculoskeletal: Normal range of motion.  Lymphadenopathy:       Head (right side): No submandibular adenopathy present.       Head (left side): No submandibular adenopathy present.    He has no cervical  adenopathy.  Neurological: He is alert and oriented to person, place, and time. He has normal strength. No cranial nerve deficit or sensory deficit.  Skin: Skin is warm and dry.  Psychiatric: He has a normal mood and affect. His speech is normal.  Nursing note and vitals reviewed.   ED Course  Procedures (including critical care time) Labs Review Labs Reviewed - No data to display  Imaging Review No results found.   EKG Interpretation None      MDM  X-ray of the soft tissue neck is negative for any acute problems, in particular there is no epididymitis. X-ray of the chest shows no pneumonia, no pneumothorax, no acute findings whatsoever. The vital signs are within normal limits. The pulse oximetry is 98% on room air. The patient is able to speak in complete sentences  Suspect the patient has symptoms related to his uvula being enlarged related to upper respiratory infection, and causing nausea and vomiting. The patient will use saltwater gargles, use Amoxil 3 times daily, and use Tylenol every 4 hours or ibuprofen every 6 hours for any fever or body aches. The patient is to return to the emergency department if any emergent changes, problems, or concerns.    Final diagnoses:  None    *I have reviewed nursing notes, vital signs, and all appropriate lab and imaging results for this patient.**    Ivery Quale, PA-C 09/25/14 2213  Samuel Jester, DO 09/30/14 Corky Crafts

## 2014-09-25 NOTE — Discharge Instructions (Signed)
The x-ray of your neck is negative for any excessive swelling, and particularly her epiglottis is within normal limits and your airway is open. The chest x-ray is negative for any pneumonia, or collapsed lung or other problem. I suspect that you have an upper respiratory infection, with enlargement of your uvula, that is stimulating the feeling that there is something in your throat, as well as interfering with the vomiting. Please use salt water gargles 3 times daily. Please use Amoxil 3 times daily. Please increase your water and juices. Please use Afrin 2 squirts to each nostril every 8 hours for 5 days only. Viral Infections A virus is a type of germ. Viruses can cause:  Minor sore throats.  Aches and pains.  Headaches.  Runny nose.  Rashes.  Watery eyes.  Tiredness.  Coughs.  Loss of appetite.  Feeling sick to your stomach (nausea).  Throwing up (vomiting).  Watery poop (diarrhea). HOME CARE   Only take medicines as told by your doctor.  Drink enough water and fluids to keep your pee (urine) clear or pale yellow. Sports drinks are a good choice.  Get plenty of rest and eat healthy. Soups and broths with crackers or rice are fine. GET HELP RIGHT AWAY IF:   You have a very bad headache.  You have shortness of breath.  You have chest pain or neck pain.  You have an unusual rash.  You cannot stop throwing up.  You have watery poop that does not stop.  You cannot keep fluids down.  You or your child has a temperature by mouth above 102 F (38.9 C), not controlled by medicine.  Your baby is older than 3 months with a rectal temperature of 102 F (38.9 C) or higher.  Your baby is 173 months old or younger with a rectal temperature of 100.4 F (38 C) or higher. MAKE SURE YOU:   Understand these instructions.  Will watch this condition.  Will get help right away if you are not doing well or get worse. Document Released: 03/29/2008 Document Revised:  07/09/2011 Document Reviewed: 08/22/2010 [facility] Patient Information 2015 [place], [place]. This information is not intended to replace advice given to you by your health care provider. Make sure you discuss any questions you have with your health care provider.

## 2014-09-25 NOTE — ED Notes (Signed)
Feels like something is stuck in throat.  States everything he drinks he vomits back up.

## 2015-07-21 ENCOUNTER — Emergency Department (HOSPITAL_COMMUNITY): Payer: Worker's Compensation

## 2015-07-21 ENCOUNTER — Encounter (HOSPITAL_COMMUNITY): Payer: Self-pay

## 2015-07-21 ENCOUNTER — Emergency Department (HOSPITAL_COMMUNITY)
Admission: EM | Admit: 2015-07-21 | Discharge: 2015-07-22 | Disposition: A | Discharge status: Home / Self Care | Payer: Worker's Compensation | Attending: Emergency Medicine | Admitting: Emergency Medicine

## 2015-07-21 DIAGNOSIS — F17200 Nicotine dependence, unspecified, uncomplicated: Secondary | ICD-10-CM | POA: Insufficient documentation

## 2015-07-21 DIAGNOSIS — S6991XA Unspecified injury of right wrist, hand and finger(s), initial encounter: Secondary | ICD-10-CM | POA: Diagnosis present

## 2015-07-21 DIAGNOSIS — S299XXA Unspecified injury of thorax, initial encounter: Secondary | ICD-10-CM | POA: Diagnosis not present

## 2015-07-21 DIAGNOSIS — R10813 Right lower quadrant abdominal tenderness: Secondary | ICD-10-CM | POA: Insufficient documentation

## 2015-07-21 DIAGNOSIS — Z23 Encounter for immunization: Secondary | ICD-10-CM | POA: Insufficient documentation

## 2015-07-21 DIAGNOSIS — Y990 Civilian activity done for income or pay: Secondary | ICD-10-CM | POA: Insufficient documentation

## 2015-07-21 DIAGNOSIS — Y9269 Other specified industrial and construction area as the place of occurrence of the external cause: Secondary | ICD-10-CM | POA: Insufficient documentation

## 2015-07-21 DIAGNOSIS — S301XXA Contusion of abdominal wall, initial encounter: Secondary | ICD-10-CM | POA: Diagnosis not present

## 2015-07-21 DIAGNOSIS — S62600B Fracture of unspecified phalanx of right index finger, initial encounter for open fracture: Secondary | ICD-10-CM | POA: Insufficient documentation

## 2015-07-21 DIAGNOSIS — Y9389 Activity, other specified: Secondary | ICD-10-CM | POA: Insufficient documentation

## 2015-07-21 DIAGNOSIS — J45909 Unspecified asthma, uncomplicated: Secondary | ICD-10-CM | POA: Diagnosis not present

## 2015-07-21 DIAGNOSIS — W228XXA Striking against or struck by other objects, initial encounter: Secondary | ICD-10-CM | POA: Insufficient documentation

## 2015-07-21 MED ORDER — LIDOCAINE HCL (PF) 2 % IJ SOLN
10.0000 mL | Freq: Once | INTRAMUSCULAR | Status: DC
  Filled 2015-07-21: qty 10

## 2015-07-21 MED ORDER — TETANUS-DIPHTH-ACELL PERTUSSIS 5-2.5-18.5 LF-MCG/0.5 IM SUSP
0.5000 mL | Freq: Once | INTRAMUSCULAR | Status: AC
  Administered 2015-07-21: 0.5 mL via INTRAMUSCULAR
  Filled 2015-07-21: qty 0.5

## 2015-07-21 MED ORDER — OXYCODONE-ACETAMINOPHEN 5-325 MG PO TABS: 2.0000 | ORAL_TABLET | ORAL | Status: DC | PRN

## 2015-07-21 MED ORDER — IOHEXOL 300 MG/ML  SOLN
100.0000 mL | Freq: Once | INTRAMUSCULAR | Status: AC | PRN
  Administered 2015-07-21: 100 mL via INTRAVENOUS

## 2015-07-21 MED ORDER — POVIDONE-IODINE 10 % EX SOLN
CUTANEOUS | Status: DC
Start: 2015-07-21 — End: 2015-07-22
  Filled 2015-07-21: qty 118

## 2015-07-21 NOTE — ED Notes (Signed)
I was at work and I had a tire blow up while I was putting air in it.  Something hit me in the left ribs and my right index finger and right hand was cut.

## 2015-07-21 NOTE — ED Notes (Signed)
In xray

## 2015-07-21 NOTE — ED Provider Notes (Signed)
CSN: [id number]     Arrival date & time 07/21/15  1917 History   First MD Initiated Contact with Patient 07/21/15 2026     Chief Complaint  Patient presents with  . Laceration  . Rib Injury     (Consider location/radiation/quality/duration/timing/severity/associated sxs/prior Treatment) HPI Comments: Patient is a 25 year old male who presents to the emergency department with a complaint of injury to the left abdomen, and the right hand.  The patient states that about 7 PM today he was working with a tire changing machine when the tire blew up and portions of it hit him in his left abdomen, left ribs. The patient also noted that he had a laceration of the palm of his right hand, and his right index finger. The patient denies being on any anticoagulation medications at this time. He states that the impact knocked wind out of him, but did not cause loss of consciousness, and did not knock him to the ground. He has been ambulatory since that time. He's not had any excessive cough or difficulty with breathing. He's not had any vomiting or other complications. He does note increasing pain in the left lower abdomen and rib area. The patient has not taken any medication for this up to this point. Movement seems to cause the pain to be worse.  The history is provided by the patient.    Past Medical History  Diagnosis Date  . Asthma    Past Surgical History  Procedure Laterality Date  . Tubes in ears     No family history on file. Social History  Substance Use Topics  . Smoking status: Current Every Day Smoker  . Smokeless tobacco: None  . Alcohol Use: Yes     Comment: occasional     Review of Systems  Constitutional: Negative for activity change.       All ROS Neg except as noted in HPI  HENT: Negative for nosebleeds.   Eyes: Negative for photophobia and discharge.  Respiratory: Negative for cough, shortness of breath and wheezing.   Cardiovascular: Negative for chest pain and  palpitations.  Gastrointestinal: Negative for abdominal pain and blood in stool.  Genitourinary: Negative for dysuria, frequency and hematuria.  Musculoskeletal: Negative for back pain, arthralgias and neck pain.  Skin: Negative.   Neurological: Negative for dizziness, seizures and speech difficulty.  Psychiatric/Behavioral: Negative for hallucinations and confusion.  All other systems reviewed and are negative.     Allergies  Review of patient's allergies indicates no known allergies.  Home Medications   Prior to Admission medications   Medication Sig Start Date End Date Taking? Authorizing Provider  amoxicillin (AMOXIL) 500 MG capsule Take 1 capsule (500 mg total) by mouth 3 (three) times daily. 09/25/14   Ivery Quale, PA-C  aspirin-acetaminophen-caffeine (EXCEDRIN MIGRAINE) [phone] MG per tablet Take 1 tablet by mouth every 6 (six) hours as needed for headache.    Historical Provider, MD  cyclobenzaprine (FLEXERIL) 10 MG tablet Take 1 tablet (10 mg total) by mouth 2 (two) times daily as needed for muscle spasms. Patient not taking: Reported on 09/25/2014 12/08/13   Janne Napoleon, NP  HYDROcodone-acetaminophen (NORCO/VICODIN) 5-325 MG per tablet Take 1 tablet by mouth every 4 (four) hours as needed. Patient not taking: Reported on 09/25/2014 12/08/13   Janne Napoleon, NP  naproxen (NAPROSYN) 500 MG tablet Take 1 tablet (500 mg total) by mouth 2 (two) times daily. Patient not taking: Reported on 09/25/2014 12/08/13   Janne Napoleon, NP  BP 173/97 mmHg  Pulse 86  Temp(Src) 98.2 F (36.8 C) (Oral)  Resp 17  Ht 6' (1.829 m)  Wt 138.347 kg  BMI 41.36 kg/m2  SpO2 98% Physical Exam  Constitutional: He is oriented to person, place, and time. He appears well-developed and well-nourished.  Non-toxic appearance.  HENT:  Head: Normocephalic.  Right Ear: Tympanic membrane and external ear normal.  Left Ear: Tympanic membrane and external ear normal.  Eyes: EOM and lids are normal. Pupils  are equal, round, and reactive to light.  Neck: Normal range of motion. Neck supple. Carotid bruit is not present.  Cardiovascular: Normal rate, regular rhythm, normal heart sounds, intact distal pulses and normal pulses.   Pulmonary/Chest: Breath sounds normal. No respiratory distress.  Pt speaks in complete sentences. Ambulatory without problem.  Abdominal: Soft. Bowel sounds are normal. There is no hepatosplenomegaly. There is tenderness in the left lower quadrant. There is no rigidity, no guarding and no CVA tenderness.    Musculoskeletal: Normal range of motion.       Hands: Lymphadenopathy:       Head (right side): No submandibular adenopathy present.       Head (left side): No submandibular adenopathy present.    He has no cervical adenopathy.  Neurological: He is alert and oriented to person, place, and time. He has normal strength. No cranial nerve deficit or sensory deficit.  Gait steady.  Skin: Skin is warm and dry.  Psychiatric: He has a normal mood and affect. His speech is normal.  Nursing note and vitals reviewed.   ED Course  .Marland KitchenLaceration Repair Date/Time: 07/21/2015 10:52 PM Performed by: Ivery Quale Authorized by: Ivery Quale Consent: Verbal consent obtained. Risks and benefits: risks, benefits and alternatives were discussed Consent given by: patient Patient understanding: patient states understanding of the procedure being performed Patient identity confirmed: arm band Time out: Immediately prior to procedure a "time out" was called to verify the correct patient, procedure, equipment, support staff and site/side marked as required. Body area: upper extremity Location details: right index finger Laceration length: 2.3 cm Foreign bodies: no foreign bodies Tendon involvement: none Nerve involvement: none Vascular damage: no Anesthesia: hematoma block Local anesthetic: lidocaine 2% without epinephrine Anesthetic total: 3 ml Patient sedated:  no Preparation: Patient was prepped and draped in the usual sterile fashion. Irrigation solution: saline Amount of cleaning: extensive Skin closure: 4-0 nylon Number of sutures: 7 Technique: simple Approximation: close Approximation difficulty: simple Dressing: gauze roll and splint Patient tolerance: Patient tolerated the procedure well with no immediate complications   (including critical care time)  FRACTURE CARE RIGHT INDEX FINGER. Patient sustained a fracture to the right index finger after a tire ruptured and came off of a tire changing machine at work. The patient has an open fracture. Discussed the fracture and the laceration with the patient in terms which he understands. The need for splinting was discussed with the patient. The procedure was described to the patient in terms which he understands, and he gives permission.  Patient identified by arm band. A sterile dressing was applied to the sutured area. The patient was fitted with a aluminum/FOAM splint with gentle flexion. The patient tolerated the procedure without problem. After the procedure the patient is good capillary refill, and no temperature changes involving the right upper extremity. Labs Review Labs Reviewed - No data to display  Imaging Review No results found. I have personally reviewed and evaluated these images and lab results as part of my medical decision-making.  EKG Interpretation None      MDM  Vital signs reviewed. X-ray of the chest and left rib detail is negative. X-ray of the right index finger reveals a mildly displaced intra-articular fracture of the base of the distal phalanx. There is a small avulsion fracture at the base of the middle phalanx.  CT scan of the abdomen reveals infiltration of the subcutaneous fat of the left anterior abdomen, no solid organ injury, and no bowel perforation appreciated.  The wound to the hand was repaired. The patient was placed in a splint. The patient  was educated on the hematoma/infiltration of the subcutaneous fat of the abdominal wall. The patient will be treated with Tylenol or ibuprofen for mild pain, Percocet for more severe pain. Because of the open fracture the patient is placed on doxycycline 2 times daily. He is referred to the hand surgery specialist concerning this open fracture. The patient's tetanus status was updated.  Questions were answered. The patient had paperwork from his job, and he is given an excuse for work over this except on days in order for him to be seen by the hand specialist. Any additional time away from work will be determined by the hand specialist (dR. [email or web address]).    Final diagnoses:  Open fracture of phalanx of right index finger, initial encounter  Hematoma of abdominal wall, initial encounter    **I have reviewed nursing notes, vital signs, and all appropriate lab and imaging results for this patient.Ivery Quale*    Kiyoto Slomski, PA-C 07/22/15 1645  Bethann BerkshireJoseph Zammit, MD 07/22/15 [phone]

## 2015-07-22 MED ORDER — OXYCODONE-ACETAMINOPHEN 5-325 MG PO TABS: 1.0000 | ORAL_TABLET | Freq: Four times a day (QID) | ORAL | Status: DC | PRN

## 2015-07-22 MED ORDER — DOXYCYCLINE HYCLATE 100 MG PO CAPS: 100.0000 mg | ORAL_CAPSULE | Freq: Two times a day (BID) | ORAL | Status: DC

## 2015-07-22 NOTE — Discharge Instructions (Signed)
You have an open fracture of the right index finger. It is important that you see Dr. Mina Marble soon as possible in his office. Please use doxycycline 2 times daily with food. Use Tylenol or ibuprofen for mild pain, use Percocet for more severe pain. Percocet may cause drowsiness, and/or constipation. Please use this medication with caution. Please apply ice to the hematoma of your abdominal wall in 15-20 minute intervals. Finger Fracture Fractures of fingers are breaks in the bones of the fingers. There are many types of fractures. There are different ways of treating these fractures. Your health care provider will discuss the best way to treat your fracture. CAUSES Traumatic injury is the main cause of broken fingers. These include:  Injuries while playing sports.  Workplace injuries.  Falls. RISK FACTORS Activities that can increase your risk of finger fractures include:  Sports.  Workplace activities that involve machinery.  A condition called osteoporosis, which can make your bones less dense and cause them to fracture more easily. SIGNS AND SYMPTOMS The main symptoms of a broken finger are pain and swelling within 15 minutes after the injury. Other symptoms include:  Bruising of your finger.  Stiffness of your finger.  Numbness of your finger.  Exposed bones (compound fracture) if the fracture is severe. DIAGNOSIS  The best way to diagnose a broken bone is with X-ray imaging. Additionally, your health care provider will use this X-ray image to evaluate the position of the broken finger bones.  TREATMENT  Finger fractures can be treated with:   Nonreduction--This means the bones are in place. The finger is splinted without changing the positions of the bone pieces. The splint is usually left on for about a week to 10 days. This will depend on your fracture and what your health care provider thinks.  Closed reduction--The bones are put back into position without using surgery.  The finger is then splinted.  Open reduction and internal fixation--The fracture site is opened. Then the bone pieces are fixed into place with pins or some type of hardware. This is seldom required. It depends on the severity of the fracture. HOME CARE INSTRUCTIONS   Follow your health care provider's instructions regarding activities, exercises, and physical therapy.  Only take over-the-counter or prescription medicines for pain, discomfort, or fever as directed by your health care provider. SEEK MEDICAL CARE IF: You have pain or swelling that limits the motion or use of your fingers. SEEK IMMEDIATE MEDICAL CARE IF:  Your finger becomes numb. MAKE SURE YOU:   Understand these instructions.  Will watch your condition.  Will get help right away if you are not doing well or get worse.   This information is not intended to replace advice given to you by your health care provider. Make sure you discuss any questions you have with your health care provider.   Document Released: 07/29/2000 Document Revised: 02/04/2013 Document Reviewed: 11/26/2012 Elsevier Interactive Patient Education 2016 [employer]  Blunt Abdominal Trauma Blunt abdominal trauma is a type of injury that involves damage to the abdominal wall or to abdominal organs, such as the liver or spleen. The damage can involve bruising, tearing, or a rupture. This type of injury does not involve a puncture of the skin. Blunt abdominal trauma can range from mild to severe. In some cases it can lead to a severe abdominal inflammation (peritonitis), severe bleeding, and a dangerous drop in blood pressure. CAUSES This injury is caused by a hard, direct hit to the abdomen. It can  happen after:  A motor vehicle accident.  Being kicked or punched in the abdomen.  Falling from a significant height. RISK FACTORS This injury is more likely to happen in people who:  Play contact sports.  Work in a job in which falls or injuries  are more likely, such as in Holiday representativeconstruction. SYMPTOMS The main symptom of this condition is pain in the abdomen. Other symptoms depend on the type and location of the injury. They can include:  Abdominal pain that spreads to the the back or shoulder.  Bruising.  Swelling.  Pain when pressing on the abdomen.  Blood in the urine.  Weakness.  Confusion.  Loss of consciousness.  Pale, dusky, cool, or sweaty skin.  Vomiting blood.  Bloody stool or bleeding from the rectum.  Trouble breathing. Symptoms of this injury can develop suddenly or slowly.  DIAGNOSIS This injury is diagnosed based on your symptoms and a physical exam. You may also have tests, including:  Blood tests.  Urine tests.  Imaging tests, such as:  A CT scan and ultrasound of your abdomen.  X-rays of your chest and abdomen.  A test in which a tube is used to flush your abdomen with fluid and check for blood (diagnostic peritoneal lavage). TREATMENT Treatment for this injury depends on its type and severity. Treatment options include:  Observation. If the injury is mild, this may be the only treatment needed.  Support of your blood pressure and breathing.  Getting blood, fluids, or medicine through an IV tube.  Antibiotic medicine.  Insertion of tubes into the stomach or bladder.  A blood transfusion.  A procedure to stop bleeding. This involves putting a long, thin tube (catheter) into one of your blood vessels (angiographic embolization).  Surgery to open up your abdomen and control bleeding or repair damage (laparotomy). This may be done if tests suggest that you have peritonitis or bleeding that cannot be controlled with angiographic embolization. HOME CARE INSTRUCTIONS  Take medicines only as directed by your health care provider.  If you were prescribed an antibiotic medicine, finish all of it even if you start to feel better.  Follow your health care provider's instructions about diet  and activity restrictions.  Keep all follow-up visits as directed by your health care provider. This is important. SEEK MEDICAL CARE IF:  You continue to have abdominal pain.  Your symptoms return.  You develop new symptoms.  You have blood in your urine or your bowel movements. SEEK IMMEDIATE MEDICAL CARE IF:  You vomit blood.  You have heavy bleeding from your rectum.  You have very bad abdominal pain.  You have trouble breathing.  You have chest pain.  You have a fever.  You have dizziness.  You pass out.   This information is not intended to replace advice given to you by your health care provider. Make sure you discuss any questions you have with your health care provider.   Document Released: 05/24/2004 Document Revised: 08/31/2014 Document Reviewed: 04/07/2014 Elsevier Interactive Patient Education [employer]

## 2015-07-28 MED FILL — Oxycodone w/ Acetaminophen Tab 5-325 MG: ORAL | Qty: 6 | Status: AC

## 2016-08-31 ENCOUNTER — Encounter (HOSPITAL_COMMUNITY): Payer: Self-pay | Admitting: *Deleted

## 2016-08-31 ENCOUNTER — Emergency Department (HOSPITAL_COMMUNITY)
Admission: EM | Admit: 2016-08-31 | Discharge: 2016-08-31 | Disposition: A | Discharge status: Home / Self Care | Payer: BLUE CROSS/BLUE SHIELD | Attending: Emergency Medicine | Admitting: Emergency Medicine

## 2016-08-31 DIAGNOSIS — F17210 Nicotine dependence, cigarettes, uncomplicated: Secondary | ICD-10-CM | POA: Diagnosis not present

## 2016-08-31 DIAGNOSIS — J45909 Unspecified asthma, uncomplicated: Secondary | ICD-10-CM | POA: Insufficient documentation

## 2016-08-31 DIAGNOSIS — H579 Unspecified disorder of eye and adnexa: Secondary | ICD-10-CM | POA: Diagnosis present

## 2016-08-31 DIAGNOSIS — H1089 Other conjunctivitis: Secondary | ICD-10-CM | POA: Diagnosis not present

## 2016-08-31 MED ORDER — TOBRAMYCIN 0.3 % OP SOLN
2.0000 [drp] | Freq: Once | OPHTHALMIC | Status: AC
  Administered 2016-08-31: 2 [drp] via OPHTHALMIC
  Filled 2016-08-31: qty 5

## 2016-08-31 MED ORDER — LORATADINE 10 MG PO TABS
10.0000 mg | ORAL_TABLET | Freq: Once | ORAL | Status: AC
  Administered 2016-08-31: 10 mg via ORAL
  Filled 2016-08-31: qty 1

## 2016-08-31 NOTE — ED Provider Notes (Signed)
[facility] Provider Note   CSN: [id number] Arrival date & time: 08/31/16  1116     History   Chief Complaint Chief Complaint  Patient presents with  . Eye Problem    HPI Frank Diaz is a 26 y.o. male.  Patient is a 26 year old male who presents to the emergency department with a complaint of problems with his eyes.  The patient states that over the last couple of days he's been getting up with increased redness and drainage from the right eye, this morning he noted a little bit of redness of the left eye. Today he felt as though he could not keep his right open. Changes in the light seemed to cause more pain and discomfort. He notices that the eye seems to be watering more than usual, and gets worse as the day goes by. He's not had any foreign body in the eye that he is aware of. He's not been exposed to any chemicals about. He presents now for assistance with this issue.      Past Medical History:  Diagnosis Date  . Asthma     There are no active problems to display for this patient.   Past Surgical History:  Procedure Laterality Date  . tubes in ears         Home Medications    Prior to Admission medications   Medication Sig Start Date End Date Taking? Authorizing Provider  doxycycline (VIBRAMYCIN) 100 MG capsule Take 1 capsule (100 mg total) by mouth 2 (two) times daily. 07/22/15   Ivery QualeHobson Tahje Borawski, PA-C  oxyCODONE-acetaminophen (PERCOCET/ROXICET) 5-325 MG tablet Take 2 tablets by mouth every 4 (four) hours as needed for severe pain. 07/21/15   Ivery QualeHobson Chike Farrington, PA-C  oxyCODONE-acetaminophen (PERCOCET/ROXICET) 5-325 MG tablet Take 1 tablet by mouth every 6 (six) hours as needed. 07/22/15   Ivery QualeHobson Carylon Tamburro, PA-C    Family History No family history on file.  Social History Social History  Substance Use Topics  . Smoking status: Current Every Day Smoker    Types: E-cigarettes  . Smokeless tobacco: Never Used  . Alcohol use Yes     Comment: occasional       Allergies   Patient has no known allergies.   Review of Systems Review of Systems  Constitutional: Negative for activity change.       All ROS Neg except as noted in HPI  HENT: Negative for nosebleeds.   Eyes: Positive for photophobia, discharge, redness and itching.  Respiratory: Negative for cough, shortness of breath and wheezing.   Cardiovascular: Negative for chest pain and palpitations.  Gastrointestinal: Negative for abdominal pain and blood in stool.  Genitourinary: Negative for dysuria, frequency and hematuria.  Musculoskeletal: Negative for arthralgias, back pain and neck pain.  Skin: Negative.   Neurological: Negative for dizziness, seizures and speech difficulty.  Psychiatric/Behavioral: Negative for confusion and hallucinations.     Physical Exam Updated Vital Signs BP (!) 163/99 (BP Location: Right Arm)   Pulse 81   Temp 98.1 F (36.7 C) (Oral)   Resp 18   Ht 6' (1.829 m)   Wt (!) 146.5 kg   SpO2 96%   BMI 43.81 kg/m   Physical Exam  Constitutional: He is oriented to person, place, and time. He appears well-developed and well-nourished.  Non-toxic appearance.  HENT:  Head: Normocephalic.  Right Ear: Tympanic membrane and external ear normal.  Left Ear: Tympanic membrane and external ear normal.  Eyes: EOM and lids are normal. Pupils are equal,  round, and reactive to light. Right conjunctiva is injected. Right conjunctiva has no hemorrhage. Left conjunctiva is injected. Left conjunctiva has no hemorrhage.  There is increased redness of the conjunctiva and bulbar conjunctiva on the right and the left. The anterior chamber is clear. The x-ray movements are intact.  Neck: Normal range of motion. Neck supple. Carotid bruit is not present.  Cardiovascular: Normal rate, regular rhythm, normal heart sounds, intact distal pulses and normal pulses.   Pulmonary/Chest: Breath sounds normal. No respiratory distress.  Abdominal: Soft. Bowel sounds are normal.  There is no tenderness. There is no guarding.  Musculoskeletal: Normal range of motion.  Lymphadenopathy:       Head (right side): No submandibular adenopathy present.       Head (left side): No submandibular adenopathy present.    He has no cervical adenopathy.  Neurological: He is alert and oriented to person, place, and time. He has normal strength. No cranial nerve deficit or sensory deficit.  Skin: Skin is warm and dry.  Psychiatric: He has a normal mood and affect. His speech is normal.  Nursing note and vitals reviewed.    ED Treatments / Results  Labs (all labs ordered are listed, but only abnormal results are displayed) Labs Reviewed - No data to display  EKG  EKG Interpretation None       Radiology No results found.  Procedures Procedures (including critical care time)  Medications Ordered in ED Medications - No data to display   Initial Impression / Assessment and Plan / ED Course  I have reviewed the triage vital signs and the nursing notes.  Pertinent labs & imaging results that were available during my care of the patient were reviewed by me and considered in my medical decision making (see chart for details).       Final Clinical Impressions(s) / ED Diagnoses MDM Vital signs reviewed. The blood pressure is elevated at 163/99. The examination suggests conjunctivitis. I discussed with the patient the contagious nature of this illness. We discussed good handwashing, and even washing surfaces to prevent the spread of the conjunctivitis. We discussed using cool compresses 3 or 4 times daily. Patient is provided with tobramycin 2 drops in each eye every 4 hours. He will use a hat with brim, as well as dark glasses to prevent pain and headache related to changing brightness of light.    Final diagnoses:  Other conjunctivitis of both eyes    New Prescriptions New Prescriptions   No medications on file     Ivery Quale, PA-C 08/31/16 1338     Derwood Kaplan, MD 09/02/16 [phone]

## 2016-08-31 NOTE — ED Triage Notes (Signed)
Pt comes in with right eye drainage and redness starting this morning when he got up. Pt Denies any vision problems.

## 2016-08-31 NOTE — Discharge Instructions (Signed)
Please use a cold compress to both eyes 3 or 4 times daily. Please use a hat with brim, and dark glasses to prevent bright light from hurting her eyes and causing headache. Please wash hands frequently. Use 2 drops of tobramycin every 4 hours for the next 4 or 5 days. This is highly contagious. Please wash off surfaces after touching them.

## 2017-01-28 IMAGING — DX DG FINGER INDEX 2+V*R*
3 series · 3 of 3 positions shown · non-contrast
Comparison: Right long finger radiographs 08/18/2009

CLINICAL DATA: Right index finger injury by a tire earlier today.
Laceration. Initial encounter.

EXAM:
RIGHT INDEX FINGER 2+V

[finger ap]
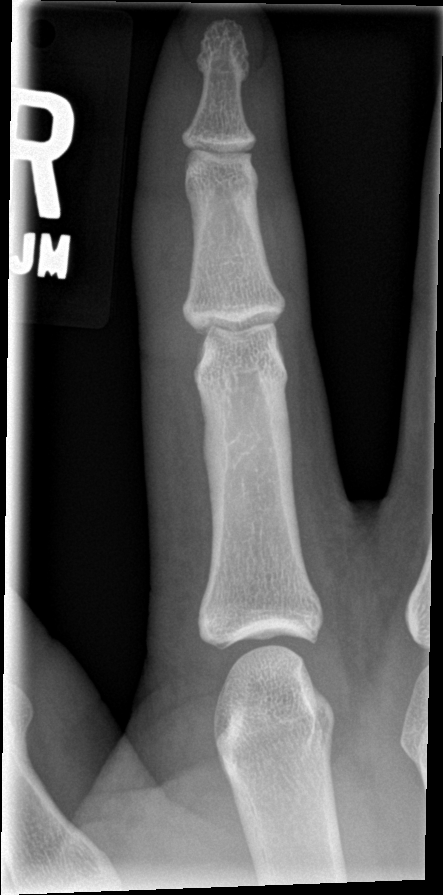

[finger obl]
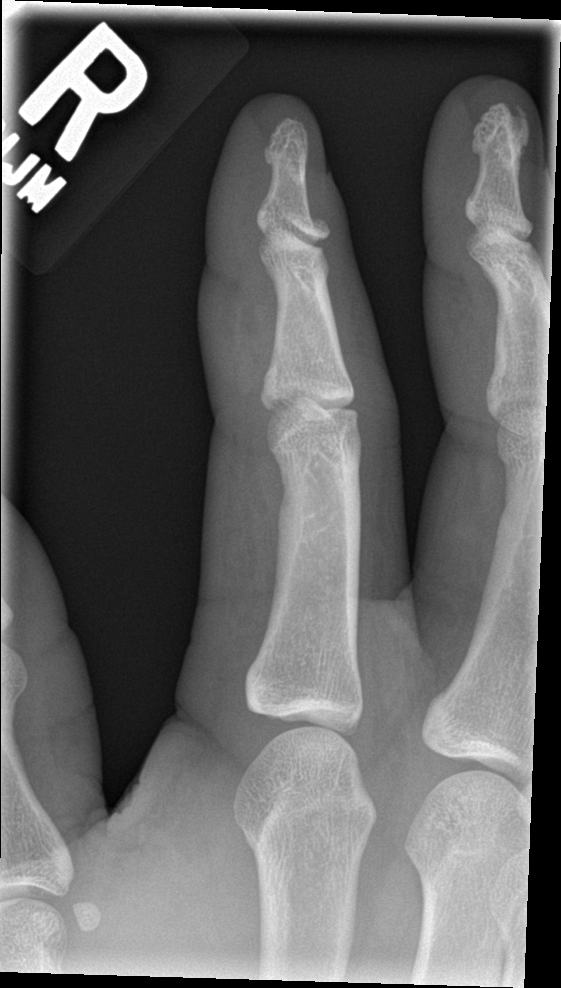

[finger lat]
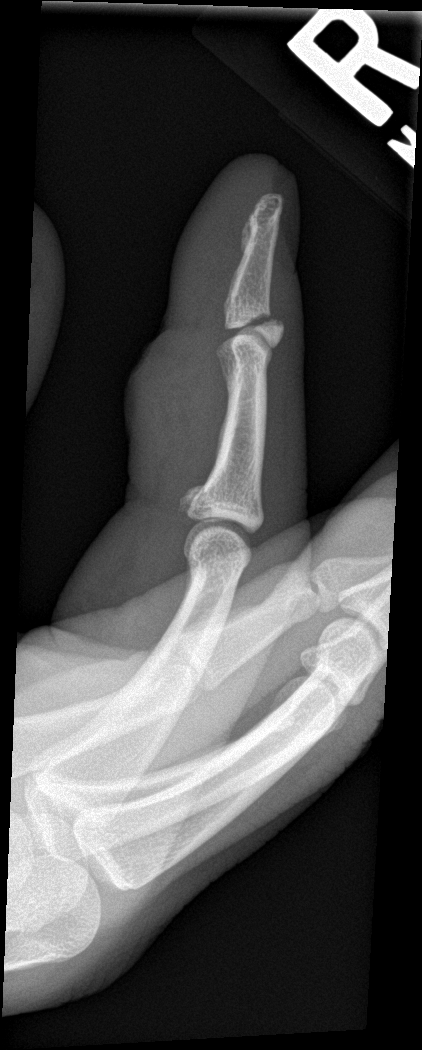

[3 of 3 positions shown; findings below may reference images not displayed]

FINDINGS: There is diffuse soft tissue swelling involving the index finger.
There is an oblique intra-articular fracture through the base of the
index finger distal phalanx which demonstrates mild palmar
displacement. There is also a small avulsion fracture from the
palmar aspect of the base of the middle phalanx. There is no
dislocation. A chronic distal tuft fracture of the long finger is
partially visualized.
IMPRESSION: 1. Mildly displaced intra-articular fracture of the base of the
index finger distal phalanx.
2. Small avulsion fracture off the base of the middle phalanx.

## 2019-02-27 ENCOUNTER — Other Ambulatory Visit: Payer: Self-pay

## 2019-02-27 DIAGNOSIS — Z20822 Contact with and (suspected) exposure to covid-19: Secondary | ICD-10-CM

## 2019-03-01 LAB — NOVEL CORONAVIRUS, NAA: SARS-CoV-2, NAA: NOT DETECTED

## 2023-03-29 ENCOUNTER — Encounter (HOSPITAL_COMMUNITY): Payer: Self-pay | Admitting: *Deleted

## 2023-03-29 ENCOUNTER — Other Ambulatory Visit: Payer: Self-pay

## 2023-03-29 ENCOUNTER — Emergency Department (HOSPITAL_COMMUNITY)
Admission: EM | Admit: 2023-03-29 | Discharge: 2023-03-29 | Disposition: A | Discharge status: Home / Self Care | Payer: 59 | Attending: Emergency Medicine | Admitting: Emergency Medicine

## 2023-03-29 ENCOUNTER — Emergency Department (HOSPITAL_COMMUNITY): Payer: 59

## 2023-03-29 DIAGNOSIS — F17210 Nicotine dependence, cigarettes, uncomplicated: Secondary | ICD-10-CM | POA: Insufficient documentation

## 2023-03-29 DIAGNOSIS — J40 Bronchitis, not specified as acute or chronic: Secondary | ICD-10-CM | POA: Diagnosis not present

## 2023-03-29 DIAGNOSIS — I509 Heart failure, unspecified: Secondary | ICD-10-CM | POA: Diagnosis not present

## 2023-03-29 DIAGNOSIS — J45909 Unspecified asthma, uncomplicated: Secondary | ICD-10-CM | POA: Insufficient documentation

## 2023-03-29 DIAGNOSIS — I110 Hypertensive heart disease with heart failure: Secondary | ICD-10-CM | POA: Diagnosis not present

## 2023-03-29 DIAGNOSIS — I10 Essential (primary) hypertension: Secondary | ICD-10-CM

## 2023-03-29 DIAGNOSIS — Z1152 Encounter for screening for COVID-19: Secondary | ICD-10-CM | POA: Diagnosis not present

## 2023-03-29 DIAGNOSIS — R0602 Shortness of breath: Secondary | ICD-10-CM | POA: Diagnosis present

## 2023-03-29 LAB — CBC WITH DIFFERENTIAL/PLATELET
Abs Immature Granulocytes: 0.03 10*3/uL (ref 0.00–0.07)
Basophils Absolute: 0.1 10*3/uL (ref 0.0–0.1)
Basophils Relative: 1 %
Eosinophils Absolute: 0.4 10*3/uL (ref 0.0–0.5)
Eosinophils Relative: 5 %
HCT: 46.6 % (ref 39.0–52.0)
Hemoglobin: 15.1 g/dL (ref 13.0–17.0)
Immature Granulocytes: 0 %
Lymphocytes Relative: 18 %
Lymphs Abs: 1.6 10*3/uL (ref 0.7–4.0)
MCH: 27 pg (ref 26.0–34.0)
MCHC: 32.4 g/dL (ref 30.0–36.0)
MCV: 83.2 fL (ref 80.0–100.0)
Monocytes Absolute: 0.9 10*3/uL (ref 0.1–1.0)
Monocytes Relative: 9 %
Neutro Abs: 6.2 10*3/uL (ref 1.7–7.7)
Neutrophils Relative %: 67 %
Platelets: 304 10*3/uL (ref 150–400)
RBC: 5.6 MIL/uL (ref 4.22–5.81)
RDW: 14.6 % (ref 11.5–15.5)
WBC: 9.3 10*3/uL (ref 4.0–10.5)
nRBC: 0 % (ref 0.0–0.2)

## 2023-03-29 LAB — BASIC METABOLIC PANEL
Anion gap: 11 (ref 5–15)
BUN: 17 mg/dL (ref 6–20)
CO2: 23 mmol/L (ref 22–32)
Calcium: 9 mg/dL (ref 8.9–10.3)
Chloride: 105 mmol/L (ref 98–111)
Creatinine, Ser: 1.33 mg/dL — ABNORMAL HIGH (ref 0.61–1.24)
GFR, Estimated: >60 mL/min (ref >60)
Glucose, Bld: 102 mg/dL — ABNORMAL HIGH (ref 70–99)
Potassium: 4 mmol/L (ref 3.5–5.1)
Sodium: 139 mmol/L (ref 135–145)

## 2023-03-29 LAB — RESP PANEL BY RT-PCR (RSV, FLU A&B, COVID)  RVPGX2
Influenza A by PCR: NEGATIVE
Influenza B by PCR: NEGATIVE
Resp Syncytial Virus by PCR: NEGATIVE
SARS Coronavirus 2 by RT PCR: NEGATIVE

## 2023-03-29 LAB — TROPONIN I (HIGH SENSITIVITY)
Troponin I (High Sensitivity): 48 ng/L — ABNORMAL HIGH (ref <18)
Troponin I (High Sensitivity): 47 ng/L — ABNORMAL HIGH (ref <18)

## 2023-03-29 LAB — D-DIMER, QUANTITATIVE: D-Dimer, Quant: 0.63 ug{FEU}/mL — ABNORMAL HIGH (ref 0.00–0.50)

## 2023-03-29 MED ORDER — AMLODIPINE BESYLATE 5 MG PO TABS
5.0000 mg | ORAL_TABLET | Freq: Once | ORAL | Status: AC
Start: 2023-03-29 — End: 2023-03-29
  Administered 2023-03-29: 5 mg via ORAL
  Filled 2023-03-29: qty 1

## 2023-03-29 MED ORDER — AMLODIPINE BESYLATE 5 MG PO TABS: 10.0000 mg | ORAL_TABLET | Freq: Every day | ORAL | 0 refills | Status: DC

## 2023-03-29 MED ORDER — SODIUM CHLORIDE 0.9 % IV SOLN
1.0000 g | Freq: Once | INTRAVENOUS | Status: AC
  Administered 2023-03-29: 1 g via INTRAVENOUS
  Filled 2023-03-29: qty 10

## 2023-03-29 MED ORDER — IOHEXOL 350 MG/ML SOLN
100.0000 mL | Freq: Once | INTRAVENOUS | Status: AC | PRN
  Administered 2023-03-29: 100 mL via INTRAVENOUS

## 2023-03-29 MED ORDER — MORPHINE SULFATE (PF) 4 MG/ML IV SOLN
4.0000 mg | Freq: Once | INTRAVENOUS | Status: AC
  Administered 2023-03-29: 4 mg via INTRAVENOUS
  Filled 2023-03-29: qty 1

## 2023-03-29 MED ORDER — ONDANSETRON HCL 4 MG/2ML IJ SOLN
4.0000 mg | Freq: Once | INTRAMUSCULAR | Status: AC
  Administered 2023-03-29: 4 mg via INTRAVENOUS
  Filled 2023-03-29: qty 2

## 2023-03-29 MED ORDER — IPRATROPIUM-ALBUTEROL 0.5-2.5 (3) MG/3ML IN SOLN
3.0000 mL | Freq: Once | RESPIRATORY_TRACT | Status: AC
  Administered 2023-03-29: 3 mL via RESPIRATORY_TRACT
  Filled 2023-03-29: qty 3

## 2023-03-29 MED ORDER — ALBUTEROL SULFATE HFA 108 (90 BASE) MCG/ACT IN AERS
2.0000 | INHALATION_SPRAY | Freq: Once | RESPIRATORY_TRACT | Status: AC
  Administered 2023-03-29: 2 via RESPIRATORY_TRACT
  Filled 2023-03-29: qty 6.7

## 2023-03-29 MED ORDER — AZITHROMYCIN 250 MG PO TABS: 250.0000 mg | ORAL_TABLET | Freq: Every day | ORAL | 0 refills | Status: DC

## 2023-03-29 NOTE — ED Notes (Signed)
Pts pulse ox while ambulating stayed around 97% RA. The lowest while ambulating was 93% RA--PA-C made aware

## 2023-03-29 NOTE — ED Notes (Signed)
Pts SpO2 87%-89% RA resting at this time, PA-C made aware. Asked pt to take some deep breaths, SpO2 still remained in the high 80s--Eventually Pts SpO2 went up tp 92% RA,

## 2023-03-29 NOTE — ED Triage Notes (Signed)
Pt c/o SOB x week and a half, coughing up blood x 3 days, vomiting blood this morning, and feeling as if his chest and right side of his body had a "spasm" this morning fro 3-4 minutes. Pt denies any numbness to right side of body during the "spasm".

## 2023-03-29 NOTE — ED Notes (Addendum)
Pt calm & cooperative with care. VSS. RA. Hypertensive. Pt endorses hx of vaping. Not on any mediations at home. This AM before going to work he coughed up blood and felt the right side of his body become "tight". No tightness or pain currently.

## 2023-03-29 NOTE — ED Provider Notes (Signed)
[place] EMERGENCY DEPARTMENT AT [facility] Provider Note   CSN: [id number] Arrival date & time: 03/29/23  4098     History  Chief Complaint  Patient presents with   Shortness of Breath    Frank Diaz is a 32 y.o. male.   Shortness of Breath Associated symptoms: chest pain and cough   Associated symptoms: no abdominal pain, no fever, no headaches, no rash, no sore throat, no vomiting and no wheezing        Frank Diaz is a 32 y.o. male with past medical history of asthma who presents to the Emergency Department complaining of shortness of breath, chest tightness, and coughing.  Symptoms present for several days.  States for 3 days he has been coughing up bright red blood.  Began as slightly pink-tinged mucus, yesterday sputum appeared more bloody.  Describes having a tightness sensation of his right chest this morning that lasted approximately 3 to 4 minutes.  Admits to smoking cigarettes and vaping.  Denies any cocaine use.  Denies any numbness or weakness of his face or extremities  Denies any throat, fever, chills, abdominal pain nausea or vomiting.  No diarrhea.    Home Medications Prior to Admission medications   Medication Sig Start Date End Date Taking? Authorizing Provider  doxycycline (VIBRAMYCIN) 100 MG capsule Take 1 capsule (100 mg total) by mouth 2 (two) times daily. 07/22/15   Ivery Quale, PA-C  oxyCODONE-acetaminophen (PERCOCET/ROXICET) 5-325 MG tablet Take 2 tablets by mouth every 4 (four) hours as needed for severe pain. 07/21/15   Ivery Quale, PA-C  oxyCODONE-acetaminophen (PERCOCET/ROXICET) 5-325 MG tablet Take 1 tablet by mouth every 6 (six) hours as needed. 07/22/15   Ivery Quale, PA-C      Allergies    Patient has no known allergies.    Review of Systems   Review of Systems  Constitutional:  Negative for appetite change, chills and fever.  HENT:  Negative for congestion, sore throat and trouble swallowing.   Respiratory:   Positive for cough and shortness of breath. Negative for chest tightness and wheezing.   Cardiovascular:  Positive for chest pain.  Gastrointestinal:  Negative for abdominal pain, constipation, diarrhea, nausea and vomiting.  Genitourinary:  Negative for difficulty urinating, dysuria and flank pain.  Musculoskeletal:  Negative for arthralgias and back pain.  Skin:  Negative for rash.  Neurological:  Negative for dizziness, weakness, numbness and headaches.    Physical Exam Updated Vital Signs BP (!) 202/157 (BP Location: Right Wrist)   Pulse 100   Temp 97.9 F (36.6 C) (Oral)   Resp (!) 23   Ht 6' (1.829 m)   Wt (!) 158.3 kg   SpO2 94%   BMI 47.33 kg/m  Physical Exam Vitals and nursing note reviewed.  Constitutional:      Appearance: Normal appearance. He is not ill-appearing.  HENT:     Mouth/Throat:     Mouth: Mucous membranes are moist.     Pharynx: Oropharynx is clear. No posterior oropharyngeal erythema.  Eyes:     Conjunctiva/sclera: Conjunctivae normal.  Cardiovascular:     Rate and Rhythm: Normal rate and regular rhythm.     Pulses: Normal pulses.  Pulmonary:     Effort: Pulmonary effort is normal. No respiratory distress.     Breath sounds: No wheezing.     Comments: Slightly diminished lung sounds bilaterally.  No increased work of breathing. Abdominal:     Palpations: Abdomen is soft.  Tenderness: There is no abdominal tenderness.  Musculoskeletal:        General: Normal range of motion.     Cervical back: Normal range of motion. No tenderness.     Right lower leg: No edema.     Left lower leg: No edema.  Lymphadenopathy:     Cervical: No cervical adenopathy.  Skin:    General: Skin is warm.     Capillary Refill: Capillary refill takes less than 2 seconds.  Neurological:     General: No focal deficit present.     Mental Status: He is alert.     Sensory: Sensation is intact. No sensory deficit.     Motor: Motor function is intact. No weakness.      Coordination: Coordination is intact.     ED Results / Procedures / Treatments   Labs (all labs ordered are listed, but only abnormal results are displayed) Labs Reviewed  BASIC METABOLIC PANEL - Abnormal; Notable for the following components:      Result Value   Glucose, Bld 102 (*)    Creatinine, Ser 1.33 (*)    All other components within normal limits  D-DIMER, QUANTITATIVE (NOT AT [facility]) - Abnormal; Notable for the following components:   D-Dimer, Quant 0.63 (*)    All other components within normal limits  TROPONIN I (HIGH SENSITIVITY) - Abnormal; Notable for the following components:   Troponin I (High Sensitivity) 48 (*)    All other components within normal limits  TROPONIN I (HIGH SENSITIVITY) - Abnormal; Notable for the following components:   Troponin I (High Sensitivity) 47 (*)    All other components within normal limits  RESP PANEL BY RT-PCR (RSV, FLU A&B, COVID)  RVPGX2  CBC WITH DIFFERENTIAL/PLATELET    EKG EKG Interpretation Date/Time:  Friday March 29 2023 08:36:45 EST Ventricular Rate:  100 PR Interval:  156 QRS Duration:  99 QT Interval:  362 QTC Calculation: 467 R Axis:   24  Text Interpretation: Sinus tachycardia Probable left atrial enlargement Confirmed by Vanetta Mulders [phone]) on 03/29/2023 9:06:21 AM  Radiology CT Angio Chest PE W and/or Wo Contrast  Result Date: 03/29/2023 CLINICAL DATA:  Shortness of breath for 1 and half weeks. Coughing up blood for 3 days. Vomiting this morning. EXAM: CT ANGIOGRAPHY CHEST WITH CONTRAST TECHNIQUE: Multidetector CT imaging of the chest was performed using the standard protocol during bolus administration of intravenous contrast. Multiplanar CT image reconstructions and MIPs were obtained to evaluate the vascular anatomy. RADIATION DOSE REDUCTION: This exam was performed according to the departmental dose-optimization program which includes automated exposure control, adjustment of the mA and/or kV according  to patient size and/or use of iterative reconstruction technique. CONTRAST:  OMNIPAQUE IOHEXOL 350 MG/ML SOLN COMPARISON:  Current and prior chest radiographs. FINDINGS: Cardiovascular: Pulmonary arteries are well opacified. There is no evidence of a pulmonary embolism. Heart is borderline enlarged. No pericardial effusion. Great vessels are normal in caliber. Mediastinum/Nodes: Prominent shoddy mediastinal lymph nodes, subcentimeter except for a subcarinal node that measures 1.8 cm short axis. No neck base, mediastinal or hilar masses. Trachea esophagus are unremarkable. Lungs/Pleura: Small pleural effusions. Ground-glass opacities noted in both lower lobes. Mild lung base interstitial thickening. No pneumothorax. Upper Abdomen: No acute findings. Incompletely visualized calcification in the upper pole the right kidney. Musculoskeletal: No chest wall abnormality. No acute or significant osseous findings. Review of the MIP images confirms the above findings. IMPRESSION: 1. No evidence of a pulmonary embolism. 2. Small pleural effusions  and mild lung base interstitial thickening. Ground-glass opacities in both lower lobes. Findings are consistent with mild CHF. Infection or inflammation should be considered in the proper clinical setting. 3. Shotty mediastinal lymph nodes, likely reactive. Electronically Signed   By: Amie Portland M.D.   On: 03/29/2023 11:41   DG Chest Portable 1 View  Result Date: 03/29/2023 CLINICAL DATA:  Cough. EXAM: PORTABLE CHEST 1 VIEW COMPARISON:  07/21/2015. FINDINGS: Low lung volume. Bilateral lung fields are clear. Bilateral costophrenic angles are clear. Mildly enlarged cardio-mediastinal silhouette, which is accentuated by low lung volume and AP technique. No acute osseous abnormalities. The soft tissues are within normal limits. IMPRESSION: *No acute pulmonary abnormality. Mildly enlarged cardiomediastinal silhouette. Electronically Signed   By: Jules Schick M.D.   On:  03/29/2023 09:51    Procedures Procedures    Medications Ordered in ED Medications - No data to display  ED Course/ Medical Decision Making/ A&P                                 Medical Decision Making Patient here with reported cough, chest tightness, shortness of breath and hemoptysis.  Symptoms for several days initially began his cough with pink sputum now states sputum appears "bloody."  Has history of asthma, has been using albuterol MDI without relief.  No fever or chills admits to smoking cigarettes and vaping.  Denies any cocaine use.  Patient nontoxic-appearing on exam, lung sounds are diminished bilaterally no hypoxia but slightly tachypneic and tachycardic.  No history of hypertension but runs in family. I suspect bronchitis, possible pneumonia, ACS, PE, viral process also considered  Amount and/or Complexity of Data Reviewed Labs: ordered. Radiology: ordered. Discussion of management or test interpretation with external provider(s): Patient ambulated in the department maintained good oxygen saturation.  Hypertensive, given amlodipine here.  Troponins elevated but flat, chest pain resolved after he received nebulizer treatment.  Low clinical suspicion for ACS.    Heart score low  On recheck, patient resting comfortably.  Breathing improved states he is feeling better.  He is ambulated in the department without hypoxia.  Shortly after ambulating, I was notified by nursing staff that patient's O2 sat dropped into the upper 80s with good waveform.  When I entered the room, patient's O2 sat was 93% on room air.  Continues to deny having any chest pain or increased shortness of breath.  States that he feels better than when he arrived.  I have offered hospital admission for his symptoms, he prefers to try outpatient therapy as scheduled and he is agreeable to prompt ER return if his symptoms worsen.  Will treat patient's cough and hemoptysis with antibiotics, discussed importance  of proper blood pressure control, smoking cessation and close outpatient follow-up.  Will give ambulatory referral to cardiology as he may need echo.  Also given follow-up information so that he may establish primary care.    Risk Prescription drug management.           Final Clinical Impression(s) / ED Diagnoses Final diagnoses:  Bronchitis  Hypertension, unspecified type  Congestive heart failure, unspecified HF chronicity, unspecified heart failure type [facility])    Rx / DC Orders ED Discharge Orders     None         Pauline Aus, PA-C 03/29/23 1539    Vanetta Mulders, MD 04/06/23 1539

## 2023-03-29 NOTE — Discharge Instructions (Signed)
Take the antibiotic as directed until it is finished.  Use the albuterol inhaler 2 puffs 4 times a day.  Your blood pressure today was very elevated.  You have been given a prescription for blood pressure medication.  Is important that you take this as directed.  Please try to monitor your blood pressure closely.  As discussed try to maintain proper low-salt diet, increased water intake, and try to stop smoking.  You have been referred to cardiology.  Someone from their office will likely call you on Monday to arrange follow-up appointment.  Have also listed 2 of the local primary care clinics so that you may contact them to establish primary care.  Return to the emergency department for any new or worsening symptoms.

## 2023-03-29 NOTE — ED Notes (Signed)
Patient transported to CT 

## 2023-04-03 ENCOUNTER — Ambulatory Visit (INDEPENDENT_AMBULATORY_CARE_PROVIDER_SITE_OTHER): Payer: 59 | Admitting: Physician Assistant

## 2023-04-03 VITALS — BP 132/94 | Ht 72.0 in | Wt 344.6 lb

## 2023-04-03 DIAGNOSIS — I10 Essential (primary) hypertension: Secondary | ICD-10-CM

## 2023-04-03 DIAGNOSIS — Z1159 Encounter for screening for other viral diseases: Secondary | ICD-10-CM

## 2023-04-03 DIAGNOSIS — R5382 Chronic fatigue, unspecified: Secondary | ICD-10-CM

## 2023-04-03 DIAGNOSIS — Z8639 Personal history of other endocrine, nutritional and metabolic disease: Secondary | ICD-10-CM | POA: Diagnosis not present

## 2023-04-03 HISTORY — DX: Chronic fatigue, unspecified: R53.82

## 2023-04-03 HISTORY — DX: Essential (primary) hypertension: I10

## 2023-04-03 MED ORDER — AMLODIPINE BESYLATE 10 MG PO TABS: 10.0000 mg | ORAL_TABLET | Freq: Every day | ORAL | 3 refills | Status: DC

## 2023-04-03 NOTE — Assessment & Plan Note (Addendum)
Patient's blood pressure slightly above goal today. Counseled on continuing healthy diet, incorporating exercise into his daily routine, and continue to avoid smoking. We talked about decreasing sodium in his diet and incorporating more lean meats and vegetables.  Amlodipine refilled for 1 year today. He is scheduled to see cardiology on Friday.

## 2023-04-03 NOTE — Assessment & Plan Note (Signed)
Patient voices concern for daily fatigue. He states mild depression, but does not desire medication at this time. He reports decreasing caffeine intake to little to none daily, however has been taking vitamin B12 for energy levels. TSH and T4 ordered today to rule out hypothyroidism.

## 2023-04-03 NOTE — Progress Notes (Signed)
New Patient Office Visit  Subjective    Patient ID: Frank Diaz, male    DOB: 05-30-1990  Age: 32 y.o. MRN: [id number]  CC:  Chief Complaint  Patient presents with   ER follow up    Feeling better but BP been running high    HPI Frank Diaz presents to establish care  32 year old male here for ER follow up and establish care. He states he first began getting sick about 2 weeks ago, but symptoms worsened around Thanksgiving and he went to the ER for evaluation on 11/29. Since then symptoms have been significantly improving. He reports finishing azithromycin this morning and reports daily compliance with amlodipine. He relates cardiology appointment scheduled for Friday. He states he is trying to eat healthier, has stopped smoking cigarettes and is decreasing frequency of vaping, as well as decreasing caffeine intake. He reports almost daily fatigue as well as frequent migraines, however states migraines are not bothersome enough at this time for further workup. States feelings of anxiety and depression, but again, not bothersome enough for further management.   Outpatient Encounter Medications as of 04/03/2023  Medication Sig   amLODipine (NORVASC) 10 MG tablet Take 1 tablet (10 mg total) by mouth daily.   [DISCONTINUED] amLODipine (NORVASC) 5 MG tablet Take 2 tablets (10 mg total) by mouth daily.   [DISCONTINUED] azithromycin (ZITHROMAX) 250 MG tablet Take 1 tablet (250 mg total) by mouth daily. Take first 2 tablets together, then 1 every day until finished.   No facility-administered encounter medications on file as of 04/03/2023.    Past Medical History:  Diagnosis Date   Asthma     Past Surgical History:  Procedure Laterality Date   FINGER FRACTURE SURGERY Right    right middle finger   tubes in ears      Family History  Problem Relation Age of Onset   Cancer Mother    Diabetes Maternal Grandmother    Hypertension Maternal Grandmother    Prostate cancer Maternal  Grandfather     Social History   Socioeconomic History   Marital status: Single    Spouse name: Not on file   Number of children: Not on file   Years of education: Not on file   Highest education level: Some college, no degree  Occupational History   Not on file  Tobacco Use   Smoking status: Some Days    Types: E-cigarettes   Smokeless tobacco: Never   Tobacco comments:    Not   Vaping Use   Vaping status: Every Day  Substance and Sexual Activity   Alcohol use: Yes    Comment: rarely   Drug use: No   Sexual activity: Not on file  Other Topics Concern   Not on file  Social History Narrative   Not on file   Social Determinants of Health   Financial Resource Strain: Medium Risk (04/03/2023)   Overall Financial Resource Strain (CARDIA)    Difficulty of Paying Living Expenses: Somewhat hard  Food Insecurity: Food Insecurity Present (04/03/2023)   Hunger Vital Sign    Worried About Running Out of Food in the Last Year: Often true    Ran Out of Food in the Last Year: Never true  Transportation Needs: No Transportation Needs (04/03/2023)   PRAPARE - Administrator, Civil Service (Medical): No    Lack of Transportation (Non-Medical): No  Physical Activity: Unknown (04/03/2023)   Exercise Vital Sign    Days of  Exercise per Week: 0 days    Minutes of Exercise per Session: Not on file  Stress: Stress Concern Present (04/03/2023)   [employer] of Occupational Health - Occupational Stress Questionnaire    Feeling of Stress : To some extent  Social Connections: Socially Isolated (04/03/2023)   Social Connection and Isolation Panel [NHANES]    Frequency of Communication with Friends and Family: Once a week    Frequency of Social Gatherings with Friends and Family: Once a week    Attends Religious Services: Never    Database administrator or Organizations: No    Attends Engineer, structural: Not on file    Marital Status: Living with partner  Intimate  Partner Violence: Not on file    Review of Systems  Constitutional:  Positive for malaise/fatigue.  All other systems reviewed and are negative.      Objective    BP (!) 132/94   Ht 6' (1.829 m)   Wt (!) 344 lb 9.6 oz (156.3 kg)   BMI 46.74 kg/m   Physical Exam Constitutional:      General: He is not in acute distress.    Appearance: Normal appearance. He is obese. He is not ill-appearing.  HENT:     Head: Normocephalic.     Mouth/Throat:     Mouth: Mucous membranes are moist.     Pharynx: Oropharynx is clear.  Eyes:     Extraocular Movements: Extraocular movements intact.  Neck:     Thyroid: No thyroid mass, thyromegaly or thyroid tenderness.     Vascular: No carotid bruit.  Cardiovascular:     Rate and Rhythm: Normal rate and regular rhythm.     Pulses: Normal pulses.     Heart sounds: No murmur heard.    No gallop.  Pulmonary:     Effort: Pulmonary effort is normal.     Breath sounds: Normal breath sounds. No wheezing, rhonchi or rales.  Abdominal:     General: Abdomen is flat.     Palpations: Abdomen is soft.  Musculoskeletal:        General: Normal range of motion.     Cervical back: Normal range of motion.  Skin:    General: Skin is warm and dry.     Capillary Refill: Capillary refill takes less than 2 seconds.  Neurological:     General: No focal deficit present.     Mental Status: He is alert and oriented to person, place, and time.     Sensory: No sensory deficit.     Motor: No weakness.     Coordination: Coordination normal.     Gait: Gait normal.  Psychiatric:        Mood and Affect: Mood normal.        Behavior: Behavior normal.        Thought Content: Thought content normal.        Judgment: Judgment normal.       Latest Ref Rng & Units 03/29/2023    9:37 AM 12/03/2012    4:03 AM  CBC  WBC 4.0 - 10.5 K/uL 9.3  17.9   Hemoglobin 13.0 - 17.0 g/dL 08.6  57.8   Hematocrit 39.0 - 52.0 % 46.6  43.6   Platelets 150 - 400 K/uL 304  312        Latest Ref Rng & Units 03/29/2023    9:37 AM 12/03/2012    4:03 AM  BMP  Glucose 70 - 99 mg/dL 469  105   BUN 6 - 20 mg/dL 17  11   Creatinine 0.86 - 1.24 mg/dL 5.78  4.69   Sodium 629 - 145 mmol/L 139  141   Potassium 3.5 - 5.1 mmol/L 4.0  3.8   Chloride 98 - 111 mmol/L 105  103   CO2 22 - 32 mmol/L 23  27   Calcium 8.9 - 10.3 mg/dL 9.0  9.4        Assessment & Plan:  Hypertension, essential Assessment & Plan: Patient's blood pressure slightly above goal today. Counseled on continuing healthy diet, incorporating exercise into his daily routine, and continue to avoid smoking. We talked about decreasing sodium in his diet and incorporating more lean meats and vegetables.  Amlodipine refilled for 1 year today. He is scheduled to see cardiology on Friday.   Orders: -     amLODIPine Besylate; Take 1 tablet (10 mg total) by mouth daily.  Dispense: 90 tablet; Refill: 3 -     Lipid panel -     TSH + free T4  Chronic fatigue Assessment & Plan: Patient voices concern for daily fatigue. He states mild depression, but does not desire medication at this time. He reports decreasing caffeine intake to little to none daily, however has been taking vitamin B12 for energy levels. TSH and T4 ordered today to rule out hypothyroidism.    Orders: -     TSH + free T4  History of elevated glucose -     Hemoglobin A1c  Need for hepatitis C screening test -     Hepatitis C antibody   Health maintenance reviewed with patient today. He denies influenza vaccine at this time.   Return in about 6 months (around 10/02/2023). Will discuss depression and anxiety management at this visit, as well as migraines. Follow up on blood pressure at this time as well.   Toni Amend Nessa Ramaker, PA-C

## 2023-04-04 LAB — TSH+FREE T4
Free T4: 1.11 ng/dL (ref 0.82–1.77)
TSH: 1.55 u[IU]/mL (ref 0.450–4.500)

## 2023-04-04 LAB — LIPID PANEL
Chol/HDL Ratio: 3.3 {ratio} (ref 0.0–5.0)
Cholesterol, Total: 148 mg/dL (ref 100–199)
HDL: 45 mg/dL (ref >39)
LDL Chol Calc (NIH): 94 mg/dL (ref 0–99)
Triglycerides: 41 mg/dL (ref 0–149)
VLDL Cholesterol Cal: 9 mg/dL (ref 5–40)

## 2023-04-04 LAB — HEMOGLOBIN A1C
Est. average glucose Bld gHb Est-mCnc: 111 mg/dL
Hgb A1c MFr Bld: 5.5 % (ref 4.8–5.6)

## 2023-04-05 ENCOUNTER — Ambulatory Visit: Payer: 59

## 2023-04-05 VITALS — BP 160/120 | HR 102 | Ht 72.0 in | Wt 342.0 lb

## 2023-04-05 DIAGNOSIS — R079 Chest pain, unspecified: Secondary | ICD-10-CM

## 2023-04-05 DIAGNOSIS — I10 Essential (primary) hypertension: Secondary | ICD-10-CM

## 2023-04-05 HISTORY — DX: Morbid (severe) obesity due to excess calories: E66.01

## 2023-04-05 HISTORY — DX: Chest pain, unspecified: R07.9

## 2023-04-05 MED ORDER — HYDROCHLOROTHIAZIDE 25 MG PO TABS: 25.0000 mg | ORAL_TABLET | Freq: Every day | ORAL | 3 refills | Status: DC

## 2023-04-05 NOTE — Progress Notes (Signed)
Cardiology Consultation:    Date:  04/05/2023   ID:  Frank Diaz, DOB 1990-12-29, MRN [id number]  PCP:  Grooms, Toni Amend, PA-C  Cardiologist:  Luretha Murphy, MD   Referring MD: Pauline Aus, PA-C   No chief complaint on file.    ASSESSMENT AND PLAN:    32 year old male patient with history of asthma, cigarette smoking recently quit, obesity, hypertension with shortness of breath with appears to be in the setting of asthma exaggeration and atypical noncardiac sounding chest pain.   Problem List Items Addressed This Visit     Hypertension, essential - Primary    Suboptimal blood pressure readings. Target below 130 over 80 mmHg. Continue amlodipine 10 mg once daily is tolerating this well.  Start hydrochlorothiazide 25 mg once daily.        Relevant Medications   hydrochlorothiazide (HYDRODIURIL) 25 MG tablet   Other Relevant Orders   ECHOCARDIOGRAM COMPLETE   Morbid obesity (HCC)    He is aware of harmful effects of morbid obesity. He is working on dietary modifications. Quit drinking Coca-Cola which she was consuming an abundance until recently.        Chest pain of uncertain etiology    His description of pain appears noncardiac in origin.  Sharp intermittent and random chest pain lasting for few seconds. Recent event asthma exacerbation likely contributing to the symptoms.  At this time we will hold off on any further additional cardiac workup to assess for CAD.  If he continues to have persistent symptoms or any features suggestive of angina, will consider further evaluate patient for CAD in future       Return to clinic in 6 weeks.  History of Present Illness:    Frank Diaz is a 32 y.o. male who is being seen today for the evaluation of chest pain and hypertension at the request of Triplett, Tammy, PA-C.  Has history of asthma, smoking cigarettes, obesity, hypertension. No significant prior cardiac history.  He follows up by  himself.  Lives with his girlfriend and his 55-year-old kid at home. 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  Recently on 03/29/2023 he was in the emergency room with symptoms of shortness of breath and chest tightness associated with coughing going on for over 3 days, with mild pink frothy sputum reported at times. Blood pressures were significantly elevated systolic 202, diastolic 157 and sinus tachycardia. EKG to review is from March 29, 2023 showing sinus tachycardia with biphasic P wave in lead V1 with increased amplitude suggests left atrial enlargement, normal PR interval 156 ms, QRS duration 99 ms with normal axis. His troponin was flat at 48, 47. D-dimer was mildly elevated at 0.63. CTA chest was done that was negative for pulmonary embolism.  Small pleural effusion and mild lung base interstitial thickening with groundglass opacities in both lower lobes were noted.  Reactive mediastinal lymph node appearance was reported. BUN 17, creatinine 1.33. Symptoms significantly improved after neb treatment in the ER.  Blood pressures were apparently elevated and he was started on amlodipine. Was recommended to follow-up with PCP and cardiologist as outpatient.  Since the ER visit he has been taking amlodipine consistently. Using albuterol consistently. Symptoms are gradually improved.  Still continues to have mild cough and noticed pinkish sputum 2 to 3 days ago but clearing up significantly compared to a week ago.  Continues to report atypical sharp chest discomfort that can happen randomly and lasts for few seconds.  Denies any sustained palpitations.  Denies any lightheadedness or syncopal episodes.  Tolerating his medications amlodipine. Has not been checking blood pressures at home but does have a cuff that he can use. Blood pressures at PCP office visit 04/03/2023 was elevated 132/94 mmHg.   Remote history of asthma as a kid but was not on treatment recently.  After the ER visit he has  started using albuterol and noticed improvement.  Smoking he has cut down for the past 2 to 3 weeks and has not been smoking in the last few days.  Used to vape intermittently advised him to quit vaping 2. Does not use alcohol. Does not use cocaine heroine or any other substances. He admits to consuming excess amounts of Coca-Cola until recently and has now quit and lost close to 10 pounds in the past 10 days.  Family history mother died in her late 51s early 94s from an unknown cancer. His estranged from his father for over 17 years and does not know medical history. He has one of his siblings without any significant health issues.  He is estranged from his other sibling.  Last lipid panel available is from April 03, 2023 LDL 94, HDL 45, triglycerides 41, total cholesterol 148. Hemoglobin A1c 5.5. TSH normal 1.55, free T41.11.  Past Medical History:  Diagnosis Date   Asthma     Past Surgical History:  Procedure Laterality Date   FINGER FRACTURE SURGERY Right    right middle finger   tubes in ears      Current Medications: Current Meds  Medication Sig   amLODipine (NORVASC) 10 MG tablet Take 1 tablet (10 mg total) by mouth daily.   hydrochlorothiazide (HYDRODIURIL) 25 MG tablet Take 1 tablet (25 mg total) by mouth daily.     Allergies:   Patient has no known allergies.   Social History   Socioeconomic History   Marital status: Single    Spouse name: Not on file   Number of children: Not on file   Years of education: Not on file   Highest education level: Some college, no degree  Occupational History   Not on file  Tobacco Use   Smoking status: Some Days    Types: E-cigarettes   Smokeless tobacco: Never   Tobacco comments:    Not   Vaping Use   Vaping status: Every Day  Substance and Sexual Activity   Alcohol use: Yes    Comment: rarely   Drug use: No   Sexual activity: Not on file  Other Topics Concern   Not on file  Social History Narrative   Not on  file   Social Determinants of Health   Financial Resource Strain: Medium Risk (04/03/2023)   Overall Financial Resource Strain (CARDIA)    Difficulty of Paying Living Expenses: Somewhat hard  Food Insecurity: Food Insecurity Present (04/03/2023)   Hunger Vital Sign    Worried About Running Out of Food in the Last Year: Often true    Ran Out of Food in the Last Year: Never true  Transportation Needs: No Transportation Needs (04/03/2023)   PRAPARE - Administrator, Civil Service (Medical): No    Lack of Transportation (Non-Medical): No  Physical Activity: Unknown (04/03/2023)   Exercise Vital Sign    Days of Exercise per Week: 0 days    Minutes of Exercise per Session: Not on file  Stress: Stress Concern Present (04/03/2023)   [employer] of Occupational Health - Occupational Stress Questionnaire    Feeling of Stress :  To some extent  Social Connections: Socially Isolated (04/03/2023)   Social Connection and Isolation Panel [NHANES]    Frequency of Communication with Friends and Family: Once a week    Frequency of Social Gatherings with Friends and Family: Once a week    Attends Religious Services: Never    Database administrator or Organizations: No    Attends Engineer, structural: Not on file    Marital Status: Living with partner     Family History: The patient's family history includes Cancer in his mother; Diabetes in his maternal grandmother; Hypertension in his maternal grandmother; Prostate cancer in his maternal grandfather. ROS:   Please see the history of present illness.    All 14 point review of systems negative except as described per history of present illness.  EKGs/Labs/Other Studies Reviewed:    The following studies were reviewed today:   EKG:       Recent Labs: 03/29/2023: BUN 17; Creatinine, Ser 1.33; Hemoglobin 15.1; Platelets 304; Potassium 4.0; Sodium 139 04/03/2023: TSH 1.550  Recent Lipid Panel    Component Value Date/Time    CHOL 148 04/03/2023 1019   TRIG 41 04/03/2023 1019   HDL 45 04/03/2023 1019   CHOLHDL 3.3 04/03/2023 1019   LDLCALC 94 04/03/2023 1019    Physical Exam:    VS:  BP (!) 160/120   Pulse (!) 102   Ht 6' (1.829 m)   Wt (!) 342 lb (155.1 kg)   SpO2 98%   BMI 46.38 kg/m     Wt Readings from Last 3 Encounters:  04/05/23 (!) 342 lb (155.1 kg)  04/03/23 (!) 344 lb 9.6 oz (156.3 kg)  03/29/23 (!) 349 lb (158.3 kg)     GENERAL:  Well nourished, well developed in no acute distress NECK: No JVD; No carotid bruits CARDIAC: RRR, S1 and S2 present, no murmurs, no rubs, no gallops CHEST:  Clear to auscultation without rales, wheezing or rhonchi  Extremities: No pitting pedal edema. Pulses bilaterally symmetric with radial 2+ and dorsalis pedis 2+ NEUROLOGIC:  Alert and oriented x 3  Medication Adjustments/Labs and Tests Ordered: Current medicines are reviewed at length with the patient today.  Concerns regarding medicines are outlined above.  Orders Placed This Encounter  Procedures   ECHOCARDIOGRAM COMPLETE   Meds ordered this encounter  Medications   hydrochlorothiazide (HYDRODIURIL) 25 MG tablet    Sig: Take 1 tablet (25 mg total) by mouth daily.    Dispense:  90 tablet    Refill:  3    Signed, Dellanira Dillow reddy Kamilia Carollo, MD, MPH, [facility]. 04/05/2023 4:25 PM    [facility]

## 2023-04-05 NOTE — Assessment & Plan Note (Signed)
His description of pain appears noncardiac in origin.  Sharp intermittent and random chest pain lasting for few seconds. Recent event asthma exacerbation likely contributing to the symptoms.  At this time we will hold off on any further additional cardiac workup to assess for CAD.  If he continues to have persistent symptoms or any features suggestive of angina, will consider further evaluate patient for CAD in future

## 2023-04-05 NOTE — Assessment & Plan Note (Signed)
Suboptimal blood pressure readings. Target below 130 over 80 mmHg. Continue amlodipine 10 mg once daily is tolerating this well.  Start hydrochlorothiazide 25 mg once daily.

## 2023-04-05 NOTE — Assessment & Plan Note (Signed)
He is aware of harmful effects of morbid obesity. He is working on dietary modifications. Quit drinking Coca-Cola which she was consuming an abundance until recently.

## 2023-04-05 NOTE — Patient Instructions (Signed)
Medication Instructions:   START: Hydrochlorothiazide 25mg  1 tablet daily   Lab Work: None Ordered If you have labs (blood work) drawn today and your tests are completely normal, you will receive your results only by: MyChart Message (if you have MyChart) OR A paper copy in the mail If you have any lab test that is abnormal or we need to change your treatment, we will call you to review the results.   Testing/Procedures: Your physician has requested that you have an echocardiogram. Echocardiography is a painless test that uses sound waves to create images of your heart. It provides your doctor with information about the size and shape of your heart and how well your heart's chambers and valves are working. This procedure takes approximately one hour. There are no restrictions for this procedure. Please do NOT wear cologne, perfume, aftershave, or lotions (deodorant is allowed). Please arrive 15 minutes prior to your appointment time.  Please note: We ask at that you not bring children with you during ultrasound (echo/ vascular) testing. Due to room size and safety concerns, children are not allowed in the ultrasound rooms during exams. Our front office staff cannot provide observation of children in our lobby area while testing is being conducted. An adult accompanying a patient to their appointment will only be allowed in the ultrasound room at the discretion of the ultrasound technician under special circumstances. We apologize for any inconvenience.    Follow-Up: At [facility], you and your health needs are our priority.  As part of our continuing mission to provide you with exceptional heart care, we have created designated Provider Care Teams.  These Care Teams include your primary Cardiologist (physician) and Advanced Practice Providers (APPs -  Physician Assistants and Nurse Practitioners) who all work together to provide you with the care you need, when you need it.  We recommend  signing up for the patient portal called "MyChart".  Sign up information is provided on this After Visit Summary.  MyChart is used to connect with patients for Virtual Visits (Telemedicine).  Patients are able to view lab/test results, encounter notes, upcoming appointments, etc.  Non-urgent messages can be sent to your provider as well.   To learn more about what you can do with MyChart, go to [email or web address].    Your next appointment:   6 week(s)  The format for your next appointment:   In Person  Provider:   Gypsy Balsam, MD    Other Instructions NA

## 2023-05-06 ENCOUNTER — Ambulatory Visit: Payer: 59

## 2023-05-17 ENCOUNTER — Telehealth: Payer: Self-pay

## 2023-05-17 NOTE — Telephone Encounter (Signed)
Lauren with [employer] called in stating they need to redo pt's auth and they need someone to request it again. Please advise. She said she is also going to fax info over to [employer]

## 2023-05-20 DIAGNOSIS — J45909 Unspecified asthma, uncomplicated: Secondary | ICD-10-CM | POA: Insufficient documentation

## 2023-05-23 ENCOUNTER — Ambulatory Visit: Payer: 59

## 2023-05-23 VITALS — BP 154/102 | HR 104 | Ht 72.0 in | Wt 350.0 lb

## 2023-05-23 DIAGNOSIS — R0609 Other forms of dyspnea: Secondary | ICD-10-CM

## 2023-05-23 DIAGNOSIS — I10 Essential (primary) hypertension: Secondary | ICD-10-CM

## 2023-05-23 MED ORDER — HYDROCHLOROTHIAZIDE 50 MG PO TABS: 50.0000 mg | ORAL_TABLET | Freq: Every day | ORAL | 1 refills | Status: DC

## 2023-05-23 MED ORDER — LOSARTAN POTASSIUM 25 MG PO TABS: 25.0000 mg | ORAL_TABLET | Freq: Every day | ORAL | 1 refills | Status: DC

## 2023-05-23 NOTE — Patient Instructions (Signed)
Medication Instructions:   START: Losartan 50mg  1 tablet daily  INCREASE: hydrochlorothiazide to 50mg  daily   Lab Work: Your physician recommends that you return for lab work in: 2 weeks You need to have labs done when you are fasting.  You can come Monday through Friday 8:30 am to 12:00 pm and 1:15 to 4:30. You do not need to make an appointment as the order has already been placed. The labs you are going to have done are BMET.    Testing/Procedures: Your physician has requested that you have an echocardiogram. Echocardiography is a painless test that uses sound waves to create images of your heart. It provides your doctor with information about the size and shape of your heart and how well your heart's chambers and valves are working. This procedure takes approximately one hour. There are no restrictions for this procedure. Please do NOT wear cologne, perfume, aftershave, or lotions (deodorant is allowed). Please arrive 15 minutes prior to your appointment time.  Please note: We ask at that you not bring children with you during ultrasound (echo/ vascular) testing. Due to room size and safety concerns, children are not allowed in the ultrasound rooms during exams. Our front office staff cannot provide observation of children in our lobby area while testing is being conducted. An adult accompanying a patient to their appointment will only be allowed in the ultrasound room at the discretion of the ultrasound technician under special circumstances. We apologize for any inconvenience.    Follow-Up: At [facility], you and your health needs are our priority.  As part of our continuing mission to provide you with exceptional heart care, we have created designated Provider Care Teams.  These Care Teams include your primary Cardiologist (physician) and Advanced Practice Providers (APPs -  Physician Assistants and Nurse Practitioners) who all work together to provide you with the care you need, when  you need it.  We recommend signing up for the patient portal called "MyChart".  Sign up information is provided on this After Visit Summary.  MyChart is used to connect with patients for Virtual Visits (Telemedicine).  Patients are able to view lab/test results, encounter notes, upcoming appointments, etc.  Non-urgent messages can be sent to your provider as well.   To learn more about what you can do with MyChart, go to [email or web address].    Your next appointment:   6 month(s)  The format for your next appointment:   In Person  Provider:   Huntley Dec, MD    Other Instructions NA

## 2023-05-23 NOTE — Assessment & Plan Note (Signed)
Uncontrolled. Target below 130 over 80 mmHg.  Continue amlodipine 10 mg once daily Titrate up hydrochlorothiazide to 50 mg once daily Add losartan 50 mg once daily, discussed action, potential side effects.  Will monitor electrolytes and kidney function with basic metabolic panel to be done in 2 weeks after starting losartan. Advised him to keep a log of blood pressure readings and send [place] copy in 2 weeks at the time of his blood work to review.  Will try to reschedule his echocardiogram at a facility that would fall in network, will [facility] with [facility].  Schedule him for follow-up visit in the office tentatively in 6 months.  If he continues to be doing well at that follow-up visit with improved blood pressure readings he can continue following up with his PCP Toni Amend Grooms [facility] and see [place] as needed.

## 2023-05-23 NOTE — Progress Notes (Signed)
Cardiology Consultation:    Date:  05/23/2023   ID:  [name], DOB [birth year], MRN [id number]  PCP:  Frank Diaz, Frank Amend, PA-C  Cardiologist:  Frank Corporal Dijon Cosens, MD   Referring MD: Frank Diaz, Frank Amend, PA-C   No chief complaint on file.    ASSESSMENT AND PLAN:   [name] 33 year old male with a history of asthma, obesity, hypertension, recently quit smoking Problem List Items Addressed This Visit     Hypertension, essential - Primary   Uncontrolled. Target below 130 over 80 mmHg.  Continue amlodipine 10 mg once daily Titrate up hydrochlorothiazide to 50 mg once daily Add losartan 50 mg once daily, discussed action, potential side effects.  Will monitor electrolytes and kidney function with basic metabolic panel to be done in 2 weeks after starting losartan. Advised him to keep a log of blood pressure readings and send [place] copy in 2 weeks at the time of his blood work to review.  Will try to reschedule his echocardiogram at a facility that would fall in network, will try with [facility].  Schedule him for follow-up visit in the office tentatively in 6 months.  If he continues to be doing well at that follow-up visit with improved blood pressure readings he can continue following up with his PCP Frank Diaz Frank Diaz [facility] and see [place] as needed.         History of Present Illness:    Frank Diaz is a 33 y.o. male who is being seen today for follow-up. Last visit with me in the office was 04/05/2023. PCP is Frank Diaz, [place], [place].  Has history of asthma, cigarette smoking recently quit, obesity, hypertension, atypical noncardiac chest pain and shortness of breath in the setting of asthma exacerbation and uncontrolled blood pressures and flat high-sensitivity troponins 48, 47 prompted initial visit.  Lives with his girlfriend and 18-year-old kid at home.  Works at an [employer].  Since his last visit with [place] he has quit smoking, has stopped drinking  sodas quit energy drinks.  Keeping himself busy at work and has been increasing activity and trying to find time for exercise.  Feels his blood pressures have not been optimally controlled at home yet.  Has been taking medications as prescribed and has been tolerating them well. Has not had to take albuterol inhaler as frequently.  Was unable to get his echocardiogram done as he found out that the test was going to be out of network to be done at facility. Past Medical History:  Diagnosis Date   Asthma    Chest pain of uncertain etiology 04/05/2023   Chronic fatigue 04/03/2023   Hypertension, essential 04/03/2023   Morbid obesity (HCC) 04/05/2023    Past Surgical History:  Procedure Laterality Date   FINGER FRACTURE SURGERY Right    right middle finger   tubes in ears      Current Medications: Current Meds  Medication Sig   albuterol (VENTOLIN HFA) 108 (90 Base) MCG/ACT inhaler Inhale 1-2 puffs into the lungs as needed for wheezing or shortness of breath.   amLODipine (NORVASC) 10 MG tablet Take 1 tablet (10 mg total) by mouth daily.   hydrochlorothiazide (HYDRODIURIL) 25 MG tablet Take 1 tablet (25 mg total) by mouth daily.     Allergies:   Patient has no known allergies.   Social History   Socioeconomic History   Marital status: Single    Spouse name: Not on file   Number of children: Not on file  Years of education: Not on file   Highest education level: Some college, no degree  Occupational History   Not on file  Tobacco Use   Smoking status: Some Days    Types: E-cigarettes   Smokeless tobacco: Never   Tobacco comments:    Not   Vaping Use   Vaping status: Every Day  Substance and Sexual Activity   Alcohol use: Yes    Comment: rarely   Drug use: No   Sexual activity: Not on file  Other Topics Concern   Not on file  Social History Narrative   Not on file   Social Drivers of Health   Financial Resource Strain: Medium Risk (04/03/2023)   Overall  Financial Resource Strain (CARDIA)    Difficulty of Paying Living Expenses: Somewhat hard  Food Insecurity: Food Insecurity Present (04/03/2023)   Hunger Vital Sign    Worried About Running Out of Food in the Last Year: Often true    Ran Out of Food in the Last Year: Never true  Transportation Needs: No Transportation Needs (04/03/2023)   PRAPARE - Administrator, Civil Service (Medical): No    Lack of Transportation (Non-Medical): No  Physical Activity: Unknown (04/03/2023)   Exercise Vital Sign    Days of Exercise per Week: 0 days    Minutes of Exercise per Session: Not on file  Stress: Stress Concern Present (04/03/2023)   [employer] of Occupational Health - Occupational Stress Questionnaire    Feeling of Stress : To some extent  Social Connections: Socially Isolated (04/03/2023)   Social Connection and Isolation Panel [NHANES]    Frequency of Communication with Friends and Family: Once a week    Frequency of Social Gatherings with Friends and Family: Once a week    Attends Religious Services: Never    Database administrator or Organizations: No    Attends Engineer, structural: Not on file    Marital Status: Living with partner     Family History: The patient's family history includes Cancer in his mother; Diabetes in his maternal grandmother; Hypertension in his maternal grandmother; Prostate cancer in his maternal grandfather. ROS:   Please see the history of present illness.    All 14 point review of systems negative except as described per history of present illness.  EKGs/Labs/Other Studies Reviewed:    The following studies were reviewed today:   EKG:       Recent Labs: 03/29/2023: BUN 17; Creatinine, Ser 1.33; Hemoglobin 15.1; Platelets 304; Potassium 4.0; Sodium 139 04/03/2023: TSH 1.550  Recent Lipid Panel    Component Value Date/Time   CHOL 148 04/03/2023 1019   TRIG 41 04/03/2023 1019   HDL 45 04/03/2023 1019   CHOLHDL 3.3 04/03/2023  1019   LDLCALC 94 04/03/2023 1019    Physical Exam:    VS:  BP (!) 154/102   Pulse (!) 104   Ht 6' (1.829 m)   Wt (!) 350 lb (158.8 kg)   SpO2 94%   BMI 47.47 kg/m     Wt Readings from Last 3 Encounters:  05/23/23 (!) 350 lb (158.8 kg)  04/05/23 (!) 342 lb (155.1 kg)  04/03/23 (!) 344 lb 9.6 oz (156.3 kg)     GENERAL:  Well nourished, well developed in no acute distress CARDIAC: RRR, S1 and S2 present, no murmurs, no rubs, no gallops CHEST:  Clear to auscultation without rales, wheezing or rhonchi  NEUROLOGIC:  Alert and oriented x 3  Medication Adjustments/Labs and Tests Ordered: Current medicines are reviewed at length with the patient today.  Concerns regarding medicines are outlined above.  No orders of the defined types were placed in this encounter.  No orders of the defined types were placed in this encounter.   Signed, Cecille Amsterdam, MD, MPH, [facility] 05/23/2023 12:23 PM    [facility]

## 2023-06-07 NOTE — Telephone Encounter (Signed)
 Preop does not do precertification's.  Preoperative team please forward to appropriate party.  Thank you for your help.  Josefa HERO. Rachid Parham NP-C     06/07/2023, 3:40 PM [facility] [address] Office [phone] Fax [phone]

## 2023-06-07 NOTE — Telephone Encounter (Signed)
 Patient called to give location where his insurance will take it to have an echo done. It is [facility]

## 2023-06-07 NOTE — Telephone Encounter (Signed)
 I will forward this to [facility] Triage, see notes from Lawana Pray, FNP.

## 2023-06-08 LAB — BASIC METABOLIC PANEL
BUN/Creatinine Ratio: 11 (ref 9–20)
BUN: 12 mg/dL (ref 6–20)
CO2: 24 mmol/L (ref 20–29)
Calcium: 9.6 mg/dL (ref 8.7–10.2)
Chloride: 101 mmol/L (ref 96–106)
Creatinine, Ser: 1.06 mg/dL (ref 0.76–1.27)
Glucose: 110 mg/dL — ABNORMAL HIGH (ref 70–99)
Potassium: 4.3 mmol/L (ref 3.5–5.2)
Sodium: 142 mmol/L (ref 134–144)
eGFR: 95 mL/min/{1.73_m2} (ref >59)

## 2023-06-20 NOTE — Telephone Encounter (Signed)
Following up for precert for patient's requested location?

## 2023-06-21 NOTE — Telephone Encounter (Signed)
LVM letting patient know the authorization had been received and on file. Also left phone number and address to facility requested/kbl 06/21/23

## 2023-07-10 ENCOUNTER — Ambulatory Visit (HOSPITAL_COMMUNITY)
Admission: RE | Admit: 2023-07-10 | Discharge: 2023-07-10 | Disposition: A | Discharge status: Home / Self Care | Payer: 59 | Source: Ambulatory Visit

## 2023-07-10 DIAGNOSIS — E669 Obesity, unspecified: Secondary | ICD-10-CM | POA: Diagnosis not present

## 2023-07-10 DIAGNOSIS — R0609 Other forms of dyspnea: Secondary | ICD-10-CM | POA: Diagnosis not present

## 2023-07-10 DIAGNOSIS — I081 Rheumatic disorders of both mitral and tricuspid valves: Secondary | ICD-10-CM | POA: Diagnosis not present

## 2023-07-10 DIAGNOSIS — F17200 Nicotine dependence, unspecified, uncomplicated: Secondary | ICD-10-CM | POA: Insufficient documentation

## 2023-07-10 LAB — ECHOCARDIOGRAM COMPLETE
AR max vel: 2.66 cm2
AV Area VTI: 2.75 cm2
AV Area mean vel: 2.55 cm2
AV Mean grad: 5 mmHg
AV Peak grad: 8.2 mmHg
Ao pk vel: 1.43 m/s
Area-P 1/2: 3.93 cm2
Calc EF: 44.4 %
S' Lateral: 4.3 cm
Single Plane A2C EF: 44.7 %
Single Plane A4C EF: 43.8 %

## 2023-07-10 NOTE — Progress Notes (Signed)
  Echocardiogram 2D Echocardiogram has been performed.  Reinaldo Raddle Cybil [place] 07/10/2023, 10:38 AM

## 2023-09-27 ENCOUNTER — Ambulatory Visit: Payer: 59 | Admitting: Physician Assistant

## 2023-11-18 ENCOUNTER — Other Ambulatory Visit: Payer: Self-pay

## 2024-03-18 ENCOUNTER — Other Ambulatory Visit: Payer: Self-pay

## 2024-05-07 ENCOUNTER — Other Ambulatory Visit: Payer: Self-pay | Admitting: Physician Assistant

## 2024-05-07 DIAGNOSIS — I10 Essential (primary) hypertension: Secondary | ICD-10-CM
